# Patient Record
Sex: Female | Born: 1985 | Race: Asian | Hispanic: No | Marital: Married | Smoking: Never smoker
Health system: Southern US, Community
[De-identification: ages and names within clinical notes are randomized; demographics above are authoritative.]

## PROBLEM LIST (undated history)

## (undated) DIAGNOSIS — R51 Headache: Secondary | ICD-10-CM

## (undated) DIAGNOSIS — F329 Major depressive disorder, single episode, unspecified: Secondary | ICD-10-CM

## (undated) DIAGNOSIS — F32A Depression, unspecified: Secondary | ICD-10-CM

## (undated) DIAGNOSIS — R519 Headache, unspecified: Secondary | ICD-10-CM

## (undated) DIAGNOSIS — F419 Anxiety disorder, unspecified: Secondary | ICD-10-CM

## (undated) HISTORY — DX: Headache, unspecified: R51.9

## (undated) HISTORY — DX: Headache: R51

## (undated) HISTORY — PX: APPENDECTOMY: SHX54

## (undated) HISTORY — PX: LEEP: SHX91

## (undated) HISTORY — DX: Depression, unspecified: F32.A

## (undated) HISTORY — DX: Anxiety disorder, unspecified: F41.9

## (undated) HISTORY — DX: Major depressive disorder, single episode, unspecified: F32.9

---

## 2018-05-09 LAB — OB RESULTS CONSOLE RUBELLA ANTIBODY, IGM: Rubella: IMMUNE

## 2018-05-09 LAB — OB RESULTS CONSOLE GC/CHLAMYDIA
Chlamydia: NEGATIVE
Gonorrhea: NEGATIVE

## 2018-05-09 LAB — OB RESULTS CONSOLE RPR: RPR: NONREACTIVE

## 2018-05-09 LAB — OB RESULTS CONSOLE HIV ANTIBODY (ROUTINE TESTING): HIV: NONREACTIVE

## 2018-05-09 LAB — OB RESULTS CONSOLE HEPATITIS B SURFACE ANTIGEN: Hepatitis B Surface Ag: NEGATIVE

## 2018-10-30 NOTE — L&D Delivery Note (Signed)
Delivery Note At 6:16 AM a viable female was delivered via Vaginal, Spontaneous (Presentation: ROA).  APGAR: 9, 9; weight pending.   Placenta status: S, I. 3V Cord with the following complications: loose nuchal x 1; delivered through.  Cord pH: n/a  Anesthesia: CLEA  Episiotomy:  n/a Lacerations: 2nd degree Suture Repair: 3.0 vicryl rapide Est. Blood Loss (mL): 200  Mom to postpartum.  Baby to Couplet care / Skin to Skin.  Mitchel Honour 12/06/2018, 6:48 AM

## 2018-11-22 ENCOUNTER — Telehealth (HOSPITAL_COMMUNITY): Payer: Self-pay | Admitting: *Deleted

## 2018-12-02 ENCOUNTER — Encounter (HOSPITAL_COMMUNITY): Payer: Self-pay | Admitting: *Deleted

## 2018-12-02 LAB — OB RESULTS CONSOLE GBS: STREP GROUP B AG: NEGATIVE

## 2018-12-02 NOTE — Telephone Encounter (Signed)
Preadmission screen  

## 2018-12-05 ENCOUNTER — Encounter (HOSPITAL_COMMUNITY): Payer: Self-pay | Admitting: *Deleted

## 2018-12-05 ENCOUNTER — Telehealth (HOSPITAL_COMMUNITY): Payer: Self-pay | Admitting: *Deleted

## 2018-12-05 NOTE — Telephone Encounter (Signed)
Preadmission screen  

## 2018-12-06 ENCOUNTER — Inpatient Hospital Stay (HOSPITAL_COMMUNITY): Admission: RE | Admit: 2018-12-06 | Payer: BLUE CROSS/BLUE SHIELD | Source: Ambulatory Visit

## 2018-12-06 ENCOUNTER — Inpatient Hospital Stay (HOSPITAL_COMMUNITY)
Admission: AD | Admit: 2018-12-06 | Discharge: 2018-12-07 | DRG: 807 | Disposition: A | Discharge status: Home / Self Care | Payer: BLUE CROSS/BLUE SHIELD | Attending: Obstetrics & Gynecology | Admitting: Obstetrics & Gynecology

## 2018-12-06 ENCOUNTER — Encounter (HOSPITAL_COMMUNITY): Payer: Self-pay | Admitting: Emergency Medicine

## 2018-12-06 ENCOUNTER — Inpatient Hospital Stay (HOSPITAL_COMMUNITY): Payer: BLUE CROSS/BLUE SHIELD | Admitting: Anesthesiology

## 2018-12-06 DIAGNOSIS — Z3A39 39 weeks gestation of pregnancy: Secondary | ICD-10-CM

## 2018-12-06 DIAGNOSIS — Z3490 Encounter for supervision of normal pregnancy, unspecified, unspecified trimester: Secondary | ICD-10-CM

## 2018-12-06 DIAGNOSIS — Z3483 Encounter for supervision of other normal pregnancy, third trimester: Secondary | ICD-10-CM | POA: Diagnosis present

## 2018-12-06 LAB — CBC
HCT: 36.7 % (ref 36.0–46.0)
Hemoglobin: 11.7 g/dL — ABNORMAL LOW (ref 12.0–15.0)
MCH: 27 pg (ref 26.0–34.0)
MCHC: 31.9 g/dL (ref 30.0–36.0)
MCV: 84.8 fL (ref 80.0–100.0)
Platelets: 272 10*3/uL (ref 150–400)
RBC: 4.33 MIL/uL (ref 3.87–5.11)
RDW: 13.8 % (ref 11.5–15.5)
WBC: 11.6 10*3/uL — ABNORMAL HIGH (ref 4.0–10.5)
nRBC: 0 % (ref 0.0–0.2)

## 2018-12-06 LAB — ABO/RH: ABO/RH(D): A POS

## 2018-12-06 LAB — TYPE AND SCREEN
ABO/RH(D): A POS
Antibody Screen: NEGATIVE

## 2018-12-06 LAB — RPR: RPR Ser Ql: NONREACTIVE

## 2018-12-06 MED ORDER — OXYTOCIN BOLUS FROM INFUSION
500.0000 mL | Freq: Once | INTRAVENOUS | Status: AC
  Administered 2018-12-06: 500 mL via INTRAVENOUS

## 2018-12-06 MED ORDER — OXYCODONE-ACETAMINOPHEN 5-325 MG PO TABS: 2.0000 | ORAL_TABLET | ORAL | Status: DC | PRN

## 2018-12-06 MED ORDER — LACTATED RINGERS IV SOLN
INTRAVENOUS | Status: DC
  Administered 2018-12-06: 05:00:00 via INTRAVENOUS

## 2018-12-06 MED ORDER — LACTATED RINGERS IV SOLN: 500.0000 mL | INTRAVENOUS | Status: DC | PRN

## 2018-12-06 MED ORDER — ONDANSETRON HCL 4 MG PO TABS: 4.0000 mg | ORAL_TABLET | ORAL | Status: DC | PRN

## 2018-12-06 MED ORDER — ZOLPIDEM TARTRATE 5 MG PO TABS: 5.0000 mg | ORAL_TABLET | Freq: Every evening | ORAL | Status: DC | PRN

## 2018-12-06 MED ORDER — LIDOCAINE HCL (PF) 1 % IJ SOLN
INTRAMUSCULAR | Status: AC
  Filled 2018-12-06: qty 30

## 2018-12-06 MED ORDER — ACETAMINOPHEN 325 MG PO TABS: 650.0000 mg | ORAL_TABLET | ORAL | Status: DC | PRN

## 2018-12-06 MED ORDER — TETANUS-DIPHTH-ACELL PERTUSSIS 5-2.5-18.5 LF-MCG/0.5 IM SUSP: 0.5000 mL | Freq: Once | INTRAMUSCULAR | Status: DC

## 2018-12-06 MED ORDER — PRENATAL MULTIVITAMIN CH
1.0000 | ORAL_TABLET | Freq: Every day | ORAL | Status: DC
  Administered 2018-12-06: 1 via ORAL
  Filled 2018-12-06: qty 1

## 2018-12-06 MED ORDER — OXYTOCIN 40 UNITS IN NORMAL SALINE INFUSION - SIMPLE MED
2.5000 [IU]/h | INTRAVENOUS | Status: DC
  Administered 2018-12-06: 2.5 [IU]/h via INTRAVENOUS

## 2018-12-06 MED ORDER — SOD CITRATE-CITRIC ACID 500-334 MG/5ML PO SOLN: 30.0000 mL | ORAL | Status: DC | PRN

## 2018-12-06 MED ORDER — WITCH HAZEL-GLYCERIN EX PADS: 1.0000 "application " | MEDICATED_PAD | CUTANEOUS | Status: DC | PRN

## 2018-12-06 MED ORDER — OXYTOCIN 40 UNITS IN NORMAL SALINE INFUSION - SIMPLE MED
INTRAVENOUS | Status: AC
  Filled 2018-12-06: qty 1000

## 2018-12-06 MED ORDER — SENNOSIDES-DOCUSATE SODIUM 8.6-50 MG PO TABS
2.0000 | ORAL_TABLET | ORAL | Status: DC
  Administered 2018-12-07: 2 via ORAL
  Filled 2018-12-06: qty 2

## 2018-12-06 MED ORDER — BENZOCAINE-MENTHOL 20-0.5 % EX AERO
1.0000 "application " | INHALATION_SPRAY | CUTANEOUS | Status: DC | PRN
  Filled 2018-12-06: qty 56

## 2018-12-06 MED ORDER — OXYCODONE-ACETAMINOPHEN 5-325 MG PO TABS: 1.0000 | ORAL_TABLET | ORAL | Status: DC | PRN

## 2018-12-06 MED ORDER — FLEET ENEMA 7-19 GM/118ML RE ENEM: 1.0000 | ENEMA | RECTAL | Status: DC | PRN

## 2018-12-06 MED ORDER — LIDOCAINE HCL (PF) 1 % IJ SOLN
30.0000 mL | INTRAMUSCULAR | Status: DC | PRN
  Filled 2018-12-06: qty 30

## 2018-12-06 MED ORDER — LIDOCAINE HCL (PF) 1 % IJ SOLN
INTRAMUSCULAR | Status: DC | PRN
  Administered 2018-12-06 (×2): 4 mL via EPIDURAL

## 2018-12-06 MED ORDER — DIPHENHYDRAMINE HCL 25 MG PO CAPS: 25.0000 mg | ORAL_CAPSULE | Freq: Four times a day (QID) | ORAL | Status: DC | PRN

## 2018-12-06 MED ORDER — LACTATED RINGERS IV SOLN: 500.0000 mL | Freq: Once | INTRAVENOUS | Status: DC

## 2018-12-06 MED ORDER — DIBUCAINE 1 % RE OINT: 1.0000 "application " | TOPICAL_OINTMENT | RECTAL | Status: DC | PRN

## 2018-12-06 MED ORDER — SIMETHICONE 80 MG PO CHEW: 80.0000 mg | CHEWABLE_TABLET | ORAL | Status: DC | PRN

## 2018-12-06 MED ORDER — ONDANSETRON HCL 4 MG/2ML IJ SOLN: 4.0000 mg | Freq: Four times a day (QID) | INTRAMUSCULAR | Status: DC | PRN

## 2018-12-06 MED ORDER — IBUPROFEN 600 MG PO TABS
600.0000 mg | ORAL_TABLET | Freq: Four times a day (QID) | ORAL | Status: DC
  Administered 2018-12-06 – 2018-12-07 (×4): 600 mg via ORAL
  Filled 2018-12-06 (×4): qty 1

## 2018-12-06 MED ORDER — PHENYLEPHRINE 40 MCG/ML (10ML) SYRINGE FOR IV PUSH (FOR BLOOD PRESSURE SUPPORT)
PREFILLED_SYRINGE | INTRAVENOUS | Status: AC
  Filled 2018-12-06: qty 20

## 2018-12-06 MED ORDER — FENTANYL 2.5 MCG/ML BUPIVACAINE 1/10 % EPIDURAL INFUSION (WH - ANES)
INTRAMUSCULAR | Status: DC | PRN
  Administered 2018-12-06: 14 mL/h via EPIDURAL

## 2018-12-06 MED ORDER — ACETAMINOPHEN 325 MG PO TABS
650.0000 mg | ORAL_TABLET | ORAL | Status: DC | PRN
  Administered 2018-12-06: 650 mg via ORAL
  Filled 2018-12-06: qty 2

## 2018-12-06 MED ORDER — COCONUT OIL OIL: 1.0000 "application " | TOPICAL_OIL | Status: DC | PRN

## 2018-12-06 MED ORDER — ONDANSETRON HCL 4 MG/2ML IJ SOLN: 4.0000 mg | INTRAMUSCULAR | Status: DC | PRN

## 2018-12-06 MED ORDER — FENTANYL 2.5 MCG/ML BUPIVACAINE 1/10 % EPIDURAL INFUSION (WH - ANES)
INTRAMUSCULAR | Status: AC
  Filled 2018-12-06: qty 100

## 2018-12-06 NOTE — MAU Note (Signed)
CTX 5-6 minutes apart since 0030.  No LOF.  Some slight bleeding since having her membranes stripped in the office today.  + FM.  No complications.  GBS negative.

## 2018-12-06 NOTE — Anesthesia Preprocedure Evaluation (Signed)
Anesthesia Evaluation  Patient identified by MRN, date of birth, ID band Patient awake    Reviewed: Allergy & Precautions, Patient's Chart, lab work & pertinent test results  History of Anesthesia Complications Negative for: history of anesthetic complications  Airway Mallampati: I  TM Distance: >3 FB Neck ROM: Full    Dental  (+) Teeth Intact   Pulmonary neg pulmonary ROS,    Pulmonary exam normal breath sounds clear to auscultation       Cardiovascular negative cardio ROS Normal cardiovascular exam Rhythm:Regular Rate:Normal     Neuro/Psych negative neurological ROS     GI/Hepatic negative GI ROS, Neg liver ROS,   Endo/Other  negative endocrine ROS  Renal/GU negative Renal ROS     Musculoskeletal negative musculoskeletal ROS (+)   Abdominal   Peds  Hematology negative hematology ROS (+)   Anesthesia Other Findings Day of surgery medications reviewed with the patient.  Reproductive/Obstetrics (+) Pregnancy                             Anesthesia Physical Anesthesia Plan  ASA: II  Anesthesia Plan: Epidural   Post-op Pain Management:    Induction:   PONV Risk Score and Plan: Treatment may vary due to age or medical condition  Airway Management Planned: Natural Airway  Additional Equipment:   Intra-op Plan:   Post-operative Plan:   Informed Consent: I have reviewed the patients History and Physical, chart, labs and discussed the procedure including the risks, benefits and alternatives for the proposed anesthesia with the patient or authorized representative who has indicated his/her understanding and acceptance.       Plan Discussed with: CRNA  Anesthesia Plan Comments:         Anesthesia Quick Evaluation

## 2018-12-06 NOTE — Anesthesia Postprocedure Evaluation (Signed)
Anesthesia Post Note  Patient: Sheryl Byrd  Procedure(s) Performed: AN AD HOC LABOR EPIDURAL     Patient location during evaluation: Mother Baby Anesthesia Type: Epidural Level of consciousness: awake and alert and oriented Pain management: satisfactory to patient Vital Signs Assessment: post-procedure vital signs reviewed and stable Respiratory status: respiratory function stable Cardiovascular status: stable Postop Assessment: no headache, no backache, epidural receding, patient able to bend at knees, no signs of nausea or vomiting and adequate PO intake Anesthetic complications: no    Last Vitals:  Vitals:   12/06/18 0910 12/06/18 1245  BP: 109/72 102/62  Pulse: 77 85  Resp: 18 16  Temp: 36.7 C 36.9 C  SpO2:      Last Pain:  Vitals:   12/06/18 1245  TempSrc: Oral  PainSc: 1    Pain Goal: Patients Stated Pain Goal: 2 (12/06/18 1245)                 Destenee Guerry

## 2018-12-06 NOTE — H&P (Signed)
Sheryl Byrd is a 33 y.o. female presenting for labor.  CTX started around 1230 this am.  She denies VB and LOF.  Active FM.  Antepartum course complicated by anxiety/depression; stable on no medications.  GBS negative.  Comfortable with CLEA.  OB History    Gravida  2   Para  1   Term  1   Preterm      AB      Living  1     SAB      TAB      Ectopic      Multiple      Live Births  1          Past Medical History:  Diagnosis Date  . Anxiety    PP anxiety  . Depression   . Headache    Past Surgical History:  Procedure Laterality Date  . APPENDECTOMY    . LEEP     Family History: family history includes Diabetes in her maternal grandfather; Heart disease in her father and paternal grandfather; Hypertension in her father; Stroke in her maternal grandmother. Social History:  reports that she has never smoked. She has never used smokeless tobacco. She reports previous alcohol use. She reports that she does not use drugs.     Maternal Diabetes: No Genetic Screening: Normal Maternal Ultrasounds/Referrals: Normal Fetal Ultrasounds or other Referrals:  None Maternal Substance Abuse:  No Significant Maternal Medications:  None Significant Maternal Lab Results:  Lab values include: Group B Strep negative Other Comments:  None  ROS Maternal Medical History:  Reason for admission: Contractions.   Contractions: Onset was 6-12 hours ago.   Frequency: regular.   Perceived severity is moderate.    Fetal activity: Perceived fetal activity is normal.   Last perceived fetal movement was within the past hour.    Prenatal complications: no prenatal complications Prenatal Complications - Diabetes: none.    Dilation: 8.5 Effacement (%): 100 Station: -1 Exam by:: Southern CompanySavannah Brendle RN Blood pressure 103/65, pulse 85, temperature 98.1 F (36.7 C), temperature source Oral, resp. rate 20, height 5\' 4"  (1.626 m), weight 63.2 kg, last menstrual period 03/06/2018, SpO2  100 %. Maternal Exam:  Uterine Assessment: Contraction strength is moderate.  Contraction frequency is regular.   Abdomen: Patient reports no abdominal tenderness. Fundal height is c/w dates.   Estimated fetal weight is 7#.   Fetal presentation: vertex     Physical Exam  Constitutional: She is oriented to person, place, and time. She appears well-developed and well-nourished.  GI: Soft. There is no rebound and no guarding.  Neurological: She is alert and oriented to person, place, and time.  Skin: Skin is warm and dry.  Psychiatric: She has a normal mood and affect. Her behavior is normal.    Prenatal labs: ABO, Rh: --/--/A POS (02/07 0330) Antibody: NEG (02/07 0330) Rubella: Immune (07/11 0000) RPR: Nonreactive (07/11 0000)  HBsAg: Negative (07/11 0000)  HIV: Non-reactive (07/11 0000)  GBS: Negative (02/03 0000)   Assessment/Plan: 32yo G2P1001 at 39 weeks with labor -Patient offered AROM but would like to wait on her mother to arrive -Anticipate NSVD  Mitchel HonourMegan Juelz Whittenberg 12/06/2018, 5:48 AM

## 2018-12-06 NOTE — Lactation Note (Signed)
This note was copied from a baby's chart. Lactation Consultation Note  Patient Name: Sheryl Byrd Date: 12/06/2018 Reason for consult: Initial assessment;Term P2, 16 hour female infant. Per parents, infant has 3 voids and 3 stools since delivery. Per mom, she breastfeed her eldest daughter for 5 months but plan to breast her son longer. Mom demonstrated hand expression and easily expressed 3 ml of colostrum that was spoon feed to infant. Mom latched infant on right breast using the cross cradle hold, nose to breast, top lip was flanged out with bottom jaw lowered, swallows heard by LC. Infant breastfeed for 10 minutes and was still breastfeeding as LC left room. Mom knows to breastfeed according hunger cues and not exceed 3 hours without breastfeeding infant. LC discussed I & O. Reviewed Baby & Me book's Breastfeeding Basics.  Mom knows to call Nurse or LC if she has any questions, concerns or need assistance with latching infant to breast. Mom made aware of O/P services, breastfeeding support groups, community resources, and our phone # for post-discharge questions.   Maternal Data Formula Feeding for Exclusion: No Has patient been taught Hand Expression?: Yes(Mom easily hande express 59ml of colostrum that was spoon feed to infant.)  Feeding Feeding Type: Breast Fed  LATCH Score Latch: Grasps breast easily, tongue down, lips flanged, rhythmical sucking.  Audible Swallowing: Spontaneous and intermittent  Type of Nipple: Everted at rest and after stimulation  Comfort (Breast/Nipple): Soft / non-tender  Hold (Positioning): Assistance needed to correctly position infant at breast and maintain latch.  LATCH Score: 9  Interventions Interventions: Breast feeding basics reviewed;Assisted with latch;Skin to skin;Adjust position;Support pillows;Hand express  Lactation Tools Discussed/Used WIC Program: No   Consult Status Consult Status: Follow-up Date:  12/07/18 Follow-up type: In-patient    Danelle Earthly 12/06/2018, 11:01 PM

## 2018-12-06 NOTE — Anesthesia Procedure Notes (Signed)
Epidural Patient location during procedure: OB Start time: 12/06/2018 4:17 AM End time: 12/06/2018 4:19 AM  Staffing Anesthesiologist: Kaylyn Layer, MD Performed: anesthesiologist   Preanesthetic Checklist Completed: patient identified, pre-op evaluation, timeout performed, IV checked, risks and benefits discussed and monitors and equipment checked  Epidural Patient position: sitting Prep: site prepped and draped and DuraPrep Patient monitoring: continuous pulse ox, blood pressure, heart rate and cardiac monitor Approach: midline Location: L3-L4 Injection technique: LOR air  Needle:  Needle type: Tuohy  Needle gauge: 17 G Needle length: 9 cm Needle insertion depth: 3.5 cm Catheter type: closed end flexible Catheter size: 19 Gauge Catheter at skin depth: 8.5 cm Test dose: negative and Other (1% lidocaine)  Assessment Events: blood not aspirated, injection not painful, no injection resistance, negative IV test and no paresthesia  Additional Notes Patient identified. Risks, benefits, and alternatives discussed with patient including but not limited to bleeding, infection, nerve damage, paralysis, failed block, incomplete pain control, headache, blood pressure changes, nausea, vomiting, reactions to medication, itching, and postpartum back pain. Confirmed with bedside nurse the patient's most recent platelet count. Confirmed with patient that they are not currently taking any anticoagulation, have any bleeding history, or any family history of bleeding disorders. Patient expressed understanding and wished to proceed. All questions were answered. Sterile technique was used throughout the entire procedure. Crisp LOR on first pass. Please see nursing notes for vital signs. Test dose was given through epidural catheter and negative prior to continuing to dose epidural or start infusion. Warning signs of high block given to the patient including shortness of breath, tingling/numbness in  hands, complete motor block, or any concerning symptoms with instructions to call for help. Patient was given instructions on fall risk and not to get out of bed. All questions and concerns addressed with instructions to call with any issues or inadequate analgesia.  Reason for block:procedure for pain

## 2018-12-07 LAB — CBC
HCT: 33 % — ABNORMAL LOW (ref 36.0–46.0)
Hemoglobin: 10.5 g/dL — ABNORMAL LOW (ref 12.0–15.0)
MCH: 26.8 pg (ref 26.0–34.0)
MCHC: 31.8 g/dL (ref 30.0–36.0)
MCV: 84.2 fL (ref 80.0–100.0)
Platelets: 258 10*3/uL (ref 150–400)
RBC: 3.92 MIL/uL (ref 3.87–5.11)
RDW: 13.9 % (ref 11.5–15.5)
WBC: 10.9 10*3/uL — ABNORMAL HIGH (ref 4.0–10.5)
nRBC: 0 % (ref 0.0–0.2)

## 2018-12-07 NOTE — Progress Notes (Signed)
CSW received consult for hx of Anxiety and Depression.  CSW met with Sheryl Byrd to offer support and complete assessment.    CSW met with Sheryl Byrd at bedside to discuss consult for history of anxiety/depression, Sheryl Byrd's mother present. CSW asked Sheryl Byrd's mother to leave during assessment with Sheryl Byrd's permission, Sheryl Byrd's mother left voluntarily. CSW introduced self and explained reason for consult. Sheryl Byrd was welcoming and pleasant during assessment. CSW and Sheryl Byrd discussed Sheryl Byrd's mental health history, Sheryl Byrd reported that she was diagnosed with depression and anxiety about 2 years ago. Sheryl Byrd reported that during that time she had multiple life stressors and was working on adjusting to everything. CSW acknowledged and validated Sheryl Byrd's experience. Sheryl Byrd denied any current depressive symptoms and reported that she feels a little anxious about having 2 young children at home now. CSW normalized Sheryl Byrd's feelings. CSW asked Sheryl Byrd about medication/therapy for depression/anxiety. Sheryl Byrd reported that she was on medication about 2 years ago and was unable to recall the name. Sheryl Byrd reported that she is no longer on any medication nor receiving therapy. CSW inquired about Sheryl Byrd's coping skills. Sheryl Byrd reported tat she snuggles with her daughter, takes a breather, reads a book, watches a show or takes a bath. CSW praised Sheryl Byrd for having healthy coping skills. Sheryl Byrd reported that her coping skills work to treat her anxiety. Sheryl Byrd presented calm and remained engaged during assessment. Sheryl Byrd possessed some insight about her mental health history and did not demonstrate any acute mental health signs/symptoms. CSW assessed for safety, Sheryl Byrd denied SI, HI and domestic violence. CSW informed Sheryl Byrd that due to her mental health history she may be more susceptible to PPD. 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   CSW provided education regarding the baby blues period vs. perinatal mood disorders,  discussed treatment and gave resources for mental health follow up if concerns arise.  CSW recommends self-evaluation during the postpartum time period using the New Mom Checklist from Postpartum Progress and encouraged Sheryl Byrd to contact a medical professional if symptoms are noted at any time.    CSW identifies no further need for intervention and no barriers to discharge at this time.  Yumalay Circle, LCSWA Clinical Social Worker [facility] Cell#: [phone]  

## 2018-12-07 NOTE — Discharge Summary (Signed)
Obstetric Discharge Summary Reason for Admission: onset of labor Prenatal Procedures: none Intrapartum Procedures: spontaneous vaginal delivery Postpartum Procedures: none Complications-Operative and Postpartum: 2 degree perineal laceration Hemoglobin  Date Value Ref Range Status  12/07/2018 10.5 (L) 12.0 - 15.0 g/dL Final   HCT  Date Value Ref Range Status  12/07/2018 33.0 (L) 36.0 - 46.0 % Final    Physical Exam:  General: alert, cooperative, appears stated age and no distress Lochia: appropriate Uterine Fundus: firm Incision: healing well DVT Evaluation: No evidence of DVT seen on physical exam.  Discharge Diagnoses: Term Pregnancy-delivered  Discharge Information: Date: 12/07/2018 Activity: pelvic rest Diet: routine Medications: None Condition: stable Instructions: refer to practice specific booklet Discharge to: home   Newborn Data: Live born female  Birth Weight: 6 lb 9.1 oz (2980 g) APGAR: 9, 9  Newborn Delivery   Birth date/time:  12/06/2018 06:16:00 Delivery type:  Vaginal, Spontaneous     Home with mother.  Sheryl Byrd 12/07/2018, 9:47 AM

## 2018-12-07 NOTE — Lactation Note (Signed)
This note was copied from a baby's chart. Lactation Consultation Note  Patient Name: Sheryl Byrd [id number] Date: 12/07/2018 Reason for consult: Follow-up assessment;Term  P2 mother whose infant is now 15 hours old.  Mother breast fed her first child for 5 months.  Mother had no questions/concerns related to breast feeding.  She will continue feeding 8-12 times/24 hours or sooner if baby shows feeding cues.  She feels like breast feeding is going well and denies pain with latching.  Engorgement prevention/treatment discussed.  Mother has a manual pump and a DEBP for home use.  She has our OP phone number for questions/concerns that may arise after discharge.  Family present.   Maternal Data Formula Feeding for Exclusion: No Has patient been taught Hand Expression?: Yes Does the patient have breastfeeding experience prior to this delivery?: Yes  Feeding Feeding Type: Breast Fed  LATCH Score                   Interventions    Lactation Tools Discussed/Used WIC Program: No   Consult Status Consult Status: Complete Date: 12/07/18 Follow-up type: Call as needed    Jermal Dismuke R Jacarius Handel 12/07/2018, 8:15 AM

## 2021-03-15 LAB — OB RESULTS CONSOLE RPR: RPR: NONREACTIVE

## 2021-03-15 LAB — OB RESULTS CONSOLE RUBELLA ANTIBODY, IGM: Rubella: IMMUNE

## 2021-03-15 LAB — OB RESULTS CONSOLE ABO/RH: RH Type: POSITIVE

## 2021-03-15 LAB — OB RESULTS CONSOLE HEPATITIS B SURFACE ANTIGEN: Hepatitis B Surface Ag: NEGATIVE

## 2021-03-18 LAB — OB RESULTS CONSOLE GC/CHLAMYDIA: Gonorrhea: NEGATIVE

## 2021-07-07 ENCOUNTER — Emergency Department (HOSPITAL_BASED_OUTPATIENT_CLINIC_OR_DEPARTMENT_OTHER)
Admission: EM | Admit: 2021-07-07 | Discharge: 2021-07-07 | Disposition: A | Discharge status: Home / Self Care | Payer: 59 | Attending: Emergency Medicine | Admitting: Emergency Medicine

## 2021-07-07 ENCOUNTER — Encounter (HOSPITAL_BASED_OUTPATIENT_CLINIC_OR_DEPARTMENT_OTHER): Payer: Self-pay | Admitting: *Deleted

## 2021-07-07 ENCOUNTER — Other Ambulatory Visit: Payer: Self-pay

## 2021-07-07 ENCOUNTER — Emergency Department (HOSPITAL_BASED_OUTPATIENT_CLINIC_OR_DEPARTMENT_OTHER): Payer: 59

## 2021-07-07 DIAGNOSIS — O26892 Other specified pregnancy related conditions, second trimester: Secondary | ICD-10-CM | POA: Diagnosis not present

## 2021-07-07 DIAGNOSIS — R0602 Shortness of breath: Secondary | ICD-10-CM | POA: Insufficient documentation

## 2021-07-07 DIAGNOSIS — Z3A26 26 weeks gestation of pregnancy: Secondary | ICD-10-CM | POA: Insufficient documentation

## 2021-07-07 LAB — CBC WITH DIFFERENTIAL/PLATELET
Abs Immature Granulocytes: 0.05 10*3/uL (ref 0.00–0.07)
Basophils Absolute: 0.1 10*3/uL (ref 0.0–0.1)
Basophils Relative: 1 %
Eosinophils Absolute: 0.1 10*3/uL (ref 0.0–0.5)
Eosinophils Relative: 1 %
HCT: 34.7 % — ABNORMAL LOW (ref 36.0–46.0)
Hemoglobin: 11.6 g/dL — ABNORMAL LOW (ref 12.0–15.0)
Immature Granulocytes: 1 %
Lymphocytes Relative: 19 %
Lymphs Abs: 2 10*3/uL (ref 0.7–4.0)
MCH: 30 pg (ref 26.0–34.0)
MCHC: 33.4 g/dL (ref 30.0–36.0)
MCV: 89.7 fL (ref 80.0–100.0)
Monocytes Absolute: 1 10*3/uL (ref 0.1–1.0)
Monocytes Relative: 10 %
Neutro Abs: 7.2 10*3/uL (ref 1.7–7.7)
Neutrophils Relative %: 68 %
Platelets: 287 10*3/uL (ref 150–400)
RBC: 3.87 MIL/uL (ref 3.87–5.11)
RDW: 12.8 % (ref 11.5–15.5)
WBC: 10.4 10*3/uL (ref 4.0–10.5)
nRBC: 0 % (ref 0.0–0.2)

## 2021-07-07 LAB — BRAIN NATRIURETIC PEPTIDE: B Natriuretic Peptide: 35.2 pg/mL (ref 0.0–100.0)

## 2021-07-07 LAB — TROPONIN I (HIGH SENSITIVITY): Troponin I (High Sensitivity): <2 ng/L (ref <18)

## 2021-07-07 LAB — BASIC METABOLIC PANEL
Anion gap: 6 (ref 5–15)
BUN: 10 mg/dL (ref 6–20)
CO2: 24 mmol/L (ref 22–32)
Calcium: 8.7 mg/dL — ABNORMAL LOW (ref 8.9–10.3)
Chloride: 106 mmol/L (ref 98–111)
Creatinine, Ser: 0.61 mg/dL (ref 0.44–1.00)
GFR, Estimated: >60 mL/min (ref >60)
Glucose, Bld: 79 mg/dL (ref 70–99)
Potassium: 3.9 mmol/L (ref 3.5–5.1)
Sodium: 136 mmol/L (ref 135–145)

## 2021-07-07 MED ORDER — IOHEXOL 350 MG/ML SOLN
75.0000 mL | Freq: Once | INTRAVENOUS | Status: AC | PRN
  Administered 2021-07-07: 75 mL via INTRAVENOUS

## 2021-07-07 NOTE — Progress Notes (Signed)
G3P2 at 26 6/7 weeks reports to [facility] ED with c/o chest pain and shortness of breath.  Monitors applied per ED RN.  Receives [facility] with Dr Rosie Fate.

## 2021-07-07 NOTE — ED Provider Notes (Signed)
[facility] EMERGENCY DEPARTMENT Provider Note   CSN: [id number] Arrival date & time: 07/07/21  1646     History Chief Complaint  Patient presents with   Shortness of Breath    Sheryl Byrd is a 35 y.o. female.  35 year old female presents at 26-week gestation with normal/routine prenatal care with complaint of shortness of breath.  Patient states that her symptoms started last night, have become progressively worse throughout the day.  Shortness of breath is worse with lying flat and sitting up, not necessarily exertional.  States that she is having pain in her back between her shoulder blades, had some improvement with Tylenol.  Patient called her OB office and was advised to the emergency room.  Patient is G3, P2, denies similar symptoms with prior pregnancies.  Denies fevers, chills, cough, congestion, lower extremity swelling vaginal bleeding, leaking fluid.  Reports normal fetal activity.      Past Medical History:  Diagnosis Date   Anxiety    PP anxiety   Depression    Headache     Patient Active Problem List   Diagnosis Date Noted   Indication for care in labor or delivery 12/06/2018   Pregnancy 12/06/2018    Past Surgical History:  Procedure Laterality Date   APPENDECTOMY     LEEP       OB History     Gravida  3   Para  2   Term  2   Preterm      AB      Living  2      SAB      IAB      Ectopic      Multiple  0   Live Births  2           Family History  Problem Relation Age of Onset   Heart disease Father    Hypertension Father    Stroke Maternal Grandmother    Diabetes Maternal Grandfather    Heart disease Paternal Grandfather     Social History   Tobacco Use   Smoking status: Never   Smokeless tobacco: Never  Vaping Use   Vaping Use: Never used  Substance Use Topics   Alcohol use: Not Currently   Drug use: Never    Home Medications Prior to Admission medications   Medication Sig Start Date End Date  Taking? Authorizing Provider  Prenatal Vit-Fe Fumarate-FA (PRENATAL MULTIVITAMIN) TABS tablet Take 1 tablet by mouth daily at 12 noon.    [provider]    Allergies    Patient has no known allergies.  Review of Systems   Review of Systems  Constitutional:  Negative for chills and fever.  Respiratory:  Positive for shortness of breath.   Cardiovascular:  Negative for chest pain.  Gastrointestinal:  Negative for abdominal pain, constipation, diarrhea, nausea and vomiting.  Genitourinary:  Negative for vaginal bleeding and vaginal discharge.  Musculoskeletal:  Positive for back pain. Negative for myalgias.  Skin:  Negative for rash and wound.  Allergic/Immunologic: Positive for immunocompromised state.  Neurological:  Negative for weakness and numbness.  All other systems reviewed and are negative.  Physical Exam Updated Vital Signs BP 101/70   Pulse 86   Temp 99 F (37.2 C)   Resp 19   Ht 5\' 4"  (1.626 m)   Wt 56.2 kg   LMP 01/04/2021   SpO2 99%   BMI 21.28 kg/m   Physical Exam Vitals and nursing note reviewed.  Constitutional:  General: She is not in acute distress.    Appearance: She is well-developed. She is not diaphoretic.  HENT:     Head: Normocephalic and atraumatic.  Cardiovascular:     Rate and Rhythm: Normal rate and regular rhythm.     Pulses: Normal pulses.  Pulmonary:     Effort: Tachypnea present.     Breath sounds: Normal breath sounds. No decreased breath sounds or wheezing.     Comments: Mildly tachypneic, able to speak in complete sentences. Chest:     Chest wall: No tenderness.  Abdominal:     Palpations: Abdomen is soft.     Comments: Gravid  Musculoskeletal:     Cervical back: Neck supple.     Thoracic back: No tenderness or bony tenderness.     Right lower leg: No tenderness. No edema.     Left lower leg: No tenderness. No edema.  Skin:    General: Skin is warm and dry.     Findings: No erythema or rash.  Neurological:      Mental Status: She is alert and oriented to person, place, and time.  Psychiatric:        Behavior: Behavior normal.    ED Results / Procedures / Treatments   Labs (all labs ordered are listed, but only abnormal results are displayed) Labs Reviewed  BASIC METABOLIC PANEL - Abnormal; Notable for the following components:      Result Value   Calcium 8.7 (*)    All other components within normal limits  CBC WITH DIFFERENTIAL/PLATELET - Abnormal; Notable for the following components:   Hemoglobin 11.6 (*)    HCT 34.7 (*)    All other components within normal limits  BRAIN NATRIURETIC PEPTIDE  TROPONIN I (HIGH SENSITIVITY)    EKG None  Radiology CT Angio Chest PE W/Cm &/Or Wo Cm  Result Date: 07/07/2021 CLINICAL DATA:  PE suspected, high prob Shortness of breath and chest pain. Pregnant patient at [redacted] weeks gestation. EXAM: CT ANGIOGRAPHY CHEST WITH CONTRAST TECHNIQUE: Multidetector CT imaging of the chest was performed using the standard protocol during bolus administration of intravenous contrast. Multiplanar CT image reconstructions and MIPs were obtained to evaluate the vascular anatomy. CONTRAST:  5mL OMNIPAQUE IOHEXOL 350 MG/ML SOLN COMPARISON:  None. FINDINGS: Cardiovascular: There are no filling defects within the pulmonary arteries to suggest pulmonary embolus. Thoracic aorta is normal in caliber. No aortic dissection or acute aortic findings. The heart is normal in size. Trace pericardial fluid anteriorly may be trace effusion or physiologic. Mediastinum/Nodes: No mediastinal or hilar adenopathy. Decompressed esophagus. No suspicious thyroid nodule. Lungs/Pleura: Mild hypoventilatory changes in the dependent lungs. No pneumonia or focal airspace disease. No pleural effusion. No findings of pulmonary edema. No pneumothorax. Trachea and central bronchi are patent. Upper Abdomen: No acute or unexpected findings. Musculoskeletal: There are no acute or suspicious osseous abnormalities. No  chest wall soft tissue abnormality. Review of the MIP images confirms the above findings. IMPRESSION: No pulmonary embolus or acute intrathoracic abnormality. Electronically Signed   By: Narda Rutherford M.D.   On: 07/07/2021 18:43    Procedures Procedures   Medications Ordered in ED Medications  iohexol (OMNIPAQUE) 350 MG/ML injection 75 mL (75 mLs Intravenous Contrast Given 07/07/21 1757)    ED Course  I have reviewed the triage vital signs and the nursing notes.  Pertinent labs & imaging results that were available during my care of the patient were reviewed by me and considered in my medical decision making (see  chart for details).  Clinical Course as of 07/07/21 Tennis Must Jul 07, 2021  8649 35 year old female presents at [redacted] weeks gestation with complaint of shortness of breath and a discomfort in her chest into her shoulder blades.  Concern for PE in pregnancy, discussed risks of radiation, feel risks of missed PE outweigh risks to the fetus and proceed with CTA of the chest which is negative for PE.  Remaining work-up is reassuring including normal CBC, BMP, BNP and negative troponin. [LM]  1854 EKG shows normal sinus rhythm with rate of 87, no ischemic changes.  Plan is for patient to follow-up with her OB, return to ED for worsening or concerning symptoms. [LM]  1854 Vitals reviewed and reassuring, O2 sat 99% on room air. [LM]    Clinical Course User Index [LM] Alden Hipp   MDM Rules/Calculators/A&P                           Final Clinical Impression(s) / ED Diagnoses Final diagnoses:  SOB (shortness of breath)    Rx / DC Orders ED Discharge Orders     None        Alden Hipp 07/07/21 Herbie Baltimore    Ernie Avena, MD 07/08/21 1209

## 2021-07-07 NOTE — Discharge Instructions (Addendum)
Follow-up with your OB office.  Your work-up today is reassuring.

## 2021-07-07 NOTE — ED Notes (Signed)
Holding on CT scan until receiving a call back from [facility] nurse

## 2021-07-07 NOTE — Progress Notes (Signed)
NST reactive for 26 6/[redacted] weeks gestation.  Cleared by OB Service.  Pt to CT for further testing at this time.   Pt has appt with Dr Elon Spanner on 9/21.  Keep appt.  Call office with any concerns or questions.

## 2021-07-07 NOTE — ED Triage Notes (Addendum)
C/o SOB started last pm increased through the day , chest pain last night which radiates to back b/w shoulder blades , relieved with tylenol, same pain today " off an on " ,  called Lorelee Cover MD , instructed to ED for eval. Pt is 26 weeks preg

## 2021-07-07 NOTE — ED Notes (Signed)
Pt taken of toco monitor for transport to CT. Carollee Herter, RN with OB states baby looks great. Provider notified.

## 2021-07-07 NOTE — ED Notes (Signed)
OB rapid response notified , Doctor, hospital

## 2021-07-07 NOTE — ED Notes (Signed)
ED Provider at bedside. 

## 2021-07-20 LAB — OB RESULTS CONSOLE HIV ANTIBODY (ROUTINE TESTING): HIV: NONREACTIVE

## 2021-09-12 ENCOUNTER — Encounter (HOSPITAL_COMMUNITY): Payer: Self-pay | Admitting: Obstetrics and Gynecology

## 2021-09-12 ENCOUNTER — Inpatient Hospital Stay (EMERGENCY_DEPARTMENT_HOSPITAL)
Admission: AD | Admit: 2021-09-12 | Discharge: 2021-09-13 | Disposition: A | Discharge status: Home / Self Care | Payer: 59 | Source: Home / Self Care | Attending: Obstetrics and Gynecology | Admitting: Obstetrics and Gynecology

## 2021-09-12 ENCOUNTER — Other Ambulatory Visit: Payer: Self-pay

## 2021-09-12 DIAGNOSIS — Z3689 Encounter for other specified antenatal screening: Secondary | ICD-10-CM

## 2021-09-12 DIAGNOSIS — O26893 Other specified pregnancy related conditions, third trimester: Secondary | ICD-10-CM | POA: Diagnosis not present

## 2021-09-12 DIAGNOSIS — O4693 Antepartum hemorrhage, unspecified, third trimester: Secondary | ICD-10-CM | POA: Diagnosis not present

## 2021-09-12 DIAGNOSIS — M549 Dorsalgia, unspecified: Secondary | ICD-10-CM | POA: Insufficient documentation

## 2021-09-12 DIAGNOSIS — O479 False labor, unspecified: Secondary | ICD-10-CM

## 2021-09-12 DIAGNOSIS — J101 Influenza due to other identified influenza virus with other respiratory manifestations: Secondary | ICD-10-CM

## 2021-09-12 DIAGNOSIS — O471 False labor at or after 37 completed weeks of gestation: Secondary | ICD-10-CM | POA: Insufficient documentation

## 2021-09-12 DIAGNOSIS — O99513 Diseases of the respiratory system complicating pregnancy, third trimester: Secondary | ICD-10-CM | POA: Insufficient documentation

## 2021-09-12 DIAGNOSIS — Z3A36 36 weeks gestation of pregnancy: Secondary | ICD-10-CM | POA: Insufficient documentation

## 2021-09-12 DIAGNOSIS — Z20822 Contact with and (suspected) exposure to covid-19: Secondary | ICD-10-CM | POA: Insufficient documentation

## 2021-09-12 DIAGNOSIS — Z369 Encounter for antenatal screening, unspecified: Secondary | ICD-10-CM | POA: Insufficient documentation

## 2021-09-12 DIAGNOSIS — O212 Late vomiting of pregnancy: Secondary | ICD-10-CM | POA: Insufficient documentation

## 2021-09-12 LAB — URINALYSIS, ROUTINE W REFLEX MICROSCOPIC
Bilirubin Urine: NEGATIVE
Glucose, UA: NEGATIVE mg/dL
Ketones, ur: 80 mg/dL — AB
Nitrite: NEGATIVE
Protein, ur: 100 mg/dL — AB
Specific Gravity, Urine: 1.02 (ref 1.005–1.030)
pH: 5 (ref 5.0–8.0)

## 2021-09-12 MED ORDER — ONDANSETRON HCL 4 MG/2ML IJ SOLN
4.0000 mg | Freq: Once | INTRAMUSCULAR | Status: AC
  Administered 2021-09-12: 4 mg via INTRAVENOUS
  Filled 2021-09-12: qty 2

## 2021-09-12 MED ORDER — ACETAMINOPHEN 10 MG/ML IV SOLN
1000.0000 mg | Freq: Four times a day (QID) | INTRAVENOUS | Status: DC
  Administered 2021-09-12: 1000 mg via INTRAVENOUS
  Filled 2021-09-12 (×3): qty 100

## 2021-09-12 MED ORDER — LACTATED RINGERS IV BOLUS
1000.0000 mL | Freq: Once | INTRAVENOUS | Status: AC
  Administered 2021-09-12: 1000 mL via INTRAVENOUS

## 2021-09-12 NOTE — MAU Note (Signed)
Having round ligament pain, lower back pain, lightening in pelvis, sore throat, severe h/a, cough, vomited x 3-4 since 1700. Denies VB or LOF. Child at home positive for flu

## 2021-09-13 ENCOUNTER — Inpatient Hospital Stay (HOSPITAL_COMMUNITY)
Admission: AD | Admit: 2021-09-13 | Discharge: 2021-09-15 | DRG: 805 | Disposition: A | Discharge status: Home / Self Care | Payer: 59 | Attending: Obstetrics and Gynecology | Admitting: Obstetrics and Gynecology

## 2021-09-13 ENCOUNTER — Other Ambulatory Visit: Payer: Self-pay

## 2021-09-13 ENCOUNTER — Inpatient Hospital Stay (HOSPITAL_COMMUNITY): Payer: 59 | Admitting: Anesthesiology

## 2021-09-13 ENCOUNTER — Encounter (HOSPITAL_COMMUNITY): Payer: Self-pay | Admitting: Obstetrics and Gynecology

## 2021-09-13 DIAGNOSIS — O26893 Other specified pregnancy related conditions, third trimester: Secondary | ICD-10-CM | POA: Diagnosis present

## 2021-09-13 DIAGNOSIS — O4693 Antepartum hemorrhage, unspecified, third trimester: Secondary | ICD-10-CM | POA: Diagnosis not present

## 2021-09-13 DIAGNOSIS — J101 Influenza due to other identified influenza virus with other respiratory manifestations: Secondary | ICD-10-CM

## 2021-09-13 DIAGNOSIS — O479 False labor, unspecified: Secondary | ICD-10-CM

## 2021-09-13 DIAGNOSIS — Z3A36 36 weeks gestation of pregnancy: Secondary | ICD-10-CM

## 2021-09-13 DIAGNOSIS — U071 COVID-19: Secondary | ICD-10-CM | POA: Diagnosis present

## 2021-09-13 DIAGNOSIS — O9852 Other viral diseases complicating childbirth: Secondary | ICD-10-CM | POA: Diagnosis present

## 2021-09-13 DIAGNOSIS — Z3689 Encounter for other specified antenatal screening: Secondary | ICD-10-CM

## 2021-09-13 LAB — URINALYSIS, ROUTINE W REFLEX MICROSCOPIC
Bilirubin Urine: NEGATIVE
Glucose, UA: NEGATIVE mg/dL
Ketones, ur: 80 mg/dL — AB
Leukocytes,Ua: NEGATIVE
Nitrite: NEGATIVE
Protein, ur: 30 mg/dL — AB
Specific Gravity, Urine: 1.012 (ref 1.005–1.030)
pH: 5 (ref 5.0–8.0)

## 2021-09-13 LAB — RESP PANEL BY RT-PCR (FLU A&B, COVID) ARPGX2
Influenza A by PCR: POSITIVE — AB
Influenza A by PCR: POSITIVE — AB
Influenza B by PCR: NEGATIVE
Influenza B by PCR: NEGATIVE
SARS Coronavirus 2 by RT PCR: NEGATIVE
SARS Coronavirus 2 by RT PCR: POSITIVE — AB

## 2021-09-13 LAB — CBC
HCT: 36.3 % (ref 36.0–46.0)
Hemoglobin: 11.6 g/dL — ABNORMAL LOW (ref 12.0–15.0)
MCH: 27.6 pg (ref 26.0–34.0)
MCHC: 32 g/dL (ref 30.0–36.0)
MCV: 86.4 fL (ref 80.0–100.0)
Platelets: 259 10*3/uL (ref 150–400)
RBC: 4.2 MIL/uL (ref 3.87–5.11)
RDW: 13.3 % (ref 11.5–15.5)
WBC: 15.2 10*3/uL — ABNORMAL HIGH (ref 4.0–10.5)
nRBC: 0 % (ref 0.0–0.2)

## 2021-09-13 LAB — TYPE AND SCREEN
ABO/RH(D): A POS
Antibody Screen: NEGATIVE

## 2021-09-13 MED ORDER — OXYTOCIN-SODIUM CHLORIDE 30-0.9 UT/500ML-% IV SOLN
INTRAVENOUS | Status: AC
  Filled 2021-09-13: qty 500

## 2021-09-13 MED ORDER — LACTATED RINGERS IV SOLN: 500.0000 mL | Freq: Once | INTRAVENOUS | Status: DC

## 2021-09-13 MED ORDER — DIPHENHYDRAMINE HCL 50 MG/ML IJ SOLN: 12.5000 mg | INTRAMUSCULAR | Status: DC | PRN

## 2021-09-13 MED ORDER — SENNOSIDES-DOCUSATE SODIUM 8.6-50 MG PO TABS
2.0000 | ORAL_TABLET | ORAL | Status: DC
  Administered 2021-09-14 – 2021-09-15 (×2): 2 via ORAL
  Filled 2021-09-13 (×2): qty 2

## 2021-09-13 MED ORDER — LACTATED RINGERS IV BOLUS
1000.0000 mL | Freq: Once | INTRAVENOUS | Status: AC
  Administered 2021-09-13: 1000 mL via INTRAVENOUS

## 2021-09-13 MED ORDER — MORPHINE SULFATE (PF) 4 MG/ML IV SOLN
4.0000 mg | Freq: Once | INTRAVENOUS | Status: AC
  Administered 2021-09-13: 4 mg via INTRAMUSCULAR
  Filled 2021-09-13: qty 1

## 2021-09-13 MED ORDER — DIBUCAINE (PERIANAL) 1 % EX OINT: 1.0000 "application " | TOPICAL_OINTMENT | CUTANEOUS | Status: DC | PRN

## 2021-09-13 MED ORDER — OXYTOCIN-SODIUM CHLORIDE 30-0.9 UT/500ML-% IV SOLN
2.5000 [IU]/h | INTRAVENOUS | Status: DC
  Administered 2021-09-13: 2.5 [IU]/h via INTRAVENOUS

## 2021-09-13 MED ORDER — LACTATED RINGERS IV SOLN: INTRAVENOUS | Status: DC

## 2021-09-13 MED ORDER — LACTATED RINGERS IV SOLN: 500.0000 mL | Freq: Once | INTRAVENOUS | Status: AC

## 2021-09-13 MED ORDER — ACETAMINOPHEN 325 MG PO TABS: 650.0000 mg | ORAL_TABLET | ORAL | Status: DC | PRN

## 2021-09-13 MED ORDER — OXYCODONE HCL 5 MG PO TABS: 5.0000 mg | ORAL_TABLET | ORAL | Status: DC | PRN

## 2021-09-13 MED ORDER — PHENYLEPHRINE 40 MCG/ML (10ML) SYRINGE FOR IV PUSH (FOR BLOOD PRESSURE SUPPORT): 80.0000 ug | PREFILLED_SYRINGE | INTRAVENOUS | Status: DC | PRN

## 2021-09-13 MED ORDER — PHENYLEPHRINE 40 MCG/ML (10ML) SYRINGE FOR IV PUSH (FOR BLOOD PRESSURE SUPPORT)
80.0000 ug | PREFILLED_SYRINGE | INTRAVENOUS | Status: DC | PRN
  Filled 2021-09-13: qty 10

## 2021-09-13 MED ORDER — FLEET ENEMA 7-19 GM/118ML RE ENEM: 1.0000 | ENEMA | RECTAL | Status: DC | PRN

## 2021-09-13 MED ORDER — OXYCODONE HCL 5 MG PO TABS: 10.0000 mg | ORAL_TABLET | ORAL | Status: DC | PRN

## 2021-09-13 MED ORDER — LACTATED RINGERS IV SOLN
500.0000 mL | INTRAVENOUS | Status: DC | PRN
  Administered 2021-09-13: 500 mL via INTRAVENOUS

## 2021-09-13 MED ORDER — ONDANSETRON HCL 4 MG/2ML IJ SOLN: 4.0000 mg | INTRAMUSCULAR | Status: DC | PRN

## 2021-09-13 MED ORDER — ONDANSETRON HCL 4 MG/2ML IJ SOLN: 4.0000 mg | Freq: Four times a day (QID) | INTRAMUSCULAR | Status: DC | PRN

## 2021-09-13 MED ORDER — LIDOCAINE HCL (PF) 1 % IJ SOLN
INTRAMUSCULAR | Status: DC | PRN
  Administered 2021-09-13: 10 mL via EPIDURAL

## 2021-09-13 MED ORDER — OXYCODONE-ACETAMINOPHEN 5-325 MG PO TABS: 1.0000 | ORAL_TABLET | ORAL | Status: DC | PRN

## 2021-09-13 MED ORDER — ONDANSETRON 4 MG PO TBDP: 4.0000 mg | ORAL_TABLET | Freq: Three times a day (TID) | ORAL | 0 refills | Status: AC | PRN

## 2021-09-13 MED ORDER — FENTANYL-BUPIVACAINE-NACL 0.5-0.125-0.9 MG/250ML-% EP SOLN: 12.0000 mL/h | EPIDURAL | Status: DC | PRN

## 2021-09-13 MED ORDER — EPHEDRINE 5 MG/ML INJ: 10.0000 mg | INTRAVENOUS | Status: DC | PRN

## 2021-09-13 MED ORDER — OXYTOCIN BOLUS FROM INFUSION
333.0000 mL | Freq: Once | INTRAVENOUS | Status: AC
  Administered 2021-09-13: 333 mL via INTRAVENOUS

## 2021-09-13 MED ORDER — ZOLPIDEM TARTRATE 5 MG PO TABS: 5.0000 mg | ORAL_TABLET | Freq: Every evening | ORAL | Status: DC | PRN

## 2021-09-13 MED ORDER — SIMETHICONE 80 MG PO CHEW: 80.0000 mg | CHEWABLE_TABLET | ORAL | Status: DC | PRN

## 2021-09-13 MED ORDER — BENZOCAINE-MENTHOL 20-0.5 % EX AERO: 1.0000 "application " | INHALATION_SPRAY | CUTANEOUS | Status: DC | PRN

## 2021-09-13 MED ORDER — WITCH HAZEL-GLYCERIN EX PADS: 1.0000 "application " | MEDICATED_PAD | CUTANEOUS | Status: DC | PRN

## 2021-09-13 MED ORDER — DIPHENHYDRAMINE HCL 25 MG PO CAPS: 25.0000 mg | ORAL_CAPSULE | Freq: Four times a day (QID) | ORAL | Status: DC | PRN

## 2021-09-13 MED ORDER — LIDOCAINE HCL (PF) 1 % IJ SOLN: 30.0000 mL | INTRAMUSCULAR | Status: DC | PRN

## 2021-09-13 MED ORDER — SOD CITRATE-CITRIC ACID 500-334 MG/5ML PO SOLN: 30.0000 mL | ORAL | Status: DC | PRN

## 2021-09-13 MED ORDER — PRENATAL MULTIVITAMIN CH
1.0000 | ORAL_TABLET | Freq: Every day | ORAL | Status: DC
  Administered 2021-09-14 – 2021-09-15 (×2): 1 via ORAL
  Filled 2021-09-13 (×2): qty 1

## 2021-09-13 MED ORDER — ONDANSETRON HCL 4 MG PO TABS: 4.0000 mg | ORAL_TABLET | ORAL | Status: DC | PRN

## 2021-09-13 MED ORDER — OXYCODONE-ACETAMINOPHEN 5-325 MG PO TABS: 2.0000 | ORAL_TABLET | ORAL | Status: DC | PRN

## 2021-09-13 MED ORDER — IBUPROFEN 600 MG PO TABS
600.0000 mg | ORAL_TABLET | Freq: Four times a day (QID) | ORAL | Status: DC
  Administered 2021-09-13 – 2021-09-15 (×8): 600 mg via ORAL
  Filled 2021-09-13 (×8): qty 1

## 2021-09-13 MED ORDER — COCONUT OIL OIL
1.0000 "application " | TOPICAL_OIL | Status: DC | PRN
  Administered 2021-09-14: 1 via TOPICAL

## 2021-09-13 MED ORDER — ACETAMINOPHEN 325 MG PO TABS
650.0000 mg | ORAL_TABLET | ORAL | Status: DC | PRN
  Administered 2021-09-13: 650 mg via ORAL
  Filled 2021-09-13: qty 2

## 2021-09-13 MED ORDER — TETANUS-DIPHTH-ACELL PERTUSSIS 5-2.5-18.5 LF-MCG/0.5 IM SUSY: 0.5000 mL | PREFILLED_SYRINGE | Freq: Once | INTRAMUSCULAR | Status: DC

## 2021-09-13 MED ORDER — FENTANYL-BUPIVACAINE-NACL 0.5-0.125-0.9 MG/250ML-% EP SOLN
12.0000 mL/h | EPIDURAL | Status: DC | PRN
  Administered 2021-09-13: 12 mL/h via EPIDURAL
  Filled 2021-09-13: qty 250

## 2021-09-13 NOTE — Progress Notes (Signed)
Operative Delivery Note At 7:50 PM a viable female was delivered via Vaginal, Spontaneous.  Presentation: vertex; Position: Right,, Occiput,, Anterior; Station: +3  Verbal consent: obtained from patient.  Risks and benefits discussed in detail.  Risks include, but are not limited to the risks of anesthesia, bleeding, infection, damage to maternal tissues, fetal cephalhematoma.  There is also the risk of inability to effect vaginal delivery of the head, or shoulder dystocia that cannot be resolved by established maneuvers, leading to the need for emergency cesarean section. Patient making rapid progress with Hx of first two deliveries in 3 contractions. She is 10 cm with a bulging bag and feeling a lot of pressure. CBC is pending. AROM for clear fluid. Patient with very strong urge to push. Vtx if OP at this time. She pushes and then requests assistance. Vtx is +3 with a push. After D/W patient Kiwi VE is applied and used for 3-4 UCs. Persistent OP with little progress. At this point CBC has returned. Patient has her control . Anesthesiology is called and epidural is placed. FHT cat one. Patient placed on her side with peanut. After about 45 min vtx is ROT and +1. Pushing resumed and she makes good progress. SVD VMI Good cry and tone at delivery. Baby then had some dusky periods and placed on blow by oxygen. Neonatology called and is evaluating baby.  APGAR: , ; weight 6 lb 11.6 oz (3050 g).   Placenta status: intact, to path.   Cord:  with the following complications: nuchal cord x 1.  Cord pH:   Anesthesia:   Instruments: Kiwi Episiotomy: None Lacerations:  second degress ML lac repaired Suture Repair: 2.0 vicryl rapide Est. Blood Loss (mL):  100  Mom to postpartum.  Baby to Couplet care / Skin to Skin.  Sheryl Byrd 09/13/2021, 8:12 PM

## 2021-09-13 NOTE — Anesthesia Procedure Notes (Signed)
Epidural Patient location during procedure: OB Start time: 09/13/2021 5:55 PM End time: 09/13/2021 6:00 PM  Staffing Anesthesiologist: Bethena Midget, MD Performed: anesthesiologist   Preanesthetic Checklist Completed: patient identified, IV checked, site marked, risks and benefits discussed, surgical consent, monitors and equipment checked, pre-op evaluation and timeout performed  Epidural Patient position: sitting Prep: DuraPrep Patient monitoring: heart rate, continuous pulse ox and blood pressure Location: L4-L5 Injection technique: LOR saline  Needle:  Needle type: Tuohy  Needle gauge: 17 G Needle length: 9 cm and 9 Needle insertion depth: 4 cm Catheter type: closed end flexible Catheter size: 20 Guage Catheter at skin depth: 10 cm Test dose: negative  Assessment Events: blood not aspirated, injection not painful, no injection resistance, no paresthesia and negative IV test  Additional Notes Patient identified. Risks/Benefits/Options discussed with patient including but not limited to bleeding, infection, nerve damage, paralysis, failed block, incomplete pain control, headache, blood pressure changes, nausea, vomiting, reactions to medication both or allergic, itching and postpartum back pain. Confirmed with bedside nurse the patient's most recent platelet count. Confirmed with patient that they are not currently taking any anticoagulation, have any bleeding history or any family history of bleeding disorders. Patient expressed understanding and wished to proceed. All questions were answered. Sterile technique was used throughout the entire procedure. Please see nursing notes for vital signs. Test dose was given through epidural needle and negative prior to continuing to dose epidural or start infusion. Warning signs of high block given to the patient including shortness of breath, tingling/numbness in hands, complete motor block, or any concerning symptoms with instructions to  call for help. Patient was given instructions on fall risk and not to get out of bed. All questions and concerns addressed with instructions to call with any issues.

## 2021-09-13 NOTE — Lactation Note (Signed)
This note was copied from a baby's chart. Lactation Consultation Note  Patient Name: Sheryl Byrd [id number] Date: 09/13/2021 Reason for consult: L&D Initial assessment;Late-preterm 34-36.6wks Age:33 hours  L&D consult with 54 minutes old infant and P3 mother. Congratulated family on newborn.  Offered assistance with latch, laid back position. "Francesco Sor" gives a few suckles, no swallows and holds nipple in mouth. Talked briefly about LPTI behavior. Discussed STS as ideal transition for infants after birth. Talked about primal reflexes. Explained LC services availability during postpartum stay. Thanked family for their time.    Maternal Data Has patient been taught Hand Expression?: Yes Does the patient have breastfeeding experience prior to this delivery?: Yes How long did the patient breastfeed?: 22 months with 2nd child; 1st child was formula-fed  Feeding Mother's Current Feeding Choice: Breast Milk  LATCH Score Latch: Repeated attempts needed to sustain latch, nipple held in mouth throughout feeding, stimulation needed to elicit sucking reflex.  Audible Swallowing: None  Type of Nipple: Everted at rest and after stimulation  Comfort (Breast/Nipple): Soft / non-tender  Hold (Positioning): Assistance needed to correctly position infant at breast and maintain latch.  LATCH Score: 6  Interventions Interventions: Breast feeding basics reviewed;Assisted with latch;Skin to skin;Breast massage;Hand express;Adjust position;Expressed milk;Support pillows;Education  Discharge Pump: Personal WIC Program: No  Consult Status Consult Status: Follow-up from L&D    [name] [name] 09/13/2021, 8:49 PM

## 2021-09-13 NOTE — MAU Provider Note (Signed)
Chief Complaint:  Contractions and Fever   Event Date/Time   First Provider Initiated Contact with Patient 09/12/21 2343     HPI: Sheryl Byrd is a 35 y.o. G3P2002 at [redacted]w[redacted]d who presents to maternity admissions reporting sore throat, cough, headache, back pain, mild contractions, nausea and vomiting (3-4x since 1700). Her symptoms began yesterday and she has a daughter at home who recently tested positive for flu A. Denies vaginal bleeding, leaking of fluid or decreased fetal movement.  Pregnancy Course: Receives prenatal care at [facility], reports an uncomplicated pregnancy.  Past Medical History:  Diagnosis Date   Anxiety    PP anxiety   Depression    Headache    OB History  Gravida Para Term Preterm AB Living  3 2 2     2   SAB IAB Ectopic Multiple Live Births        0 2    # Outcome Date GA Lbr Len/2nd Weight Sex Delivery Anes PTL Lv  3 Current           2 Term 12/06/18 [redacted]w[redacted]d 05:27 / 00:19 6 lb 9.1 oz (2.98 kg) M Vag-Spont EPI  LIV     Birth Comments: n/a  1 Term 2018 [redacted]w[redacted]d   F Vag-Spont EPI  LIV   Past Surgical History:  Procedure Laterality Date   APPENDECTOMY     LEEP     Family History  Problem Relation Age of Onset   Heart disease Father    Hypertension Father    Stroke Maternal Grandmother    Diabetes Maternal Grandfather    Heart disease Paternal Grandfather    Social History   Tobacco Use   Smoking status: Never   Smokeless tobacco: Never  Vaping Use   Vaping Use: Never used  Substance Use Topics   Alcohol use: Not Currently   Drug use: Never   No Known Allergies Medications Prior to Admission  Medication Sig Dispense Refill Last Dose   acetaminophen (TYLENOL) 500 MG tablet Take 500 mg by mouth every 6 (six) hours as needed.   09/12/2021   Prenatal Vit-Fe Fumarate-FA (PRENATAL MULTIVITAMIN) TABS tablet Take 1 tablet by mouth daily at 12 noon.   More than a month   I have reviewed patient's Past Medical Hx, Surgical Hx,  Family Hx, Social Hx, medications and allergies.   ROS:  Review of Systems  Constitutional:  Positive for fatigue and fever.  HENT:  Positive for sore throat.   Eyes:  Negative for photophobia and visual disturbance.  Respiratory:  Positive for cough. Negative for shortness of breath.   Gastrointestinal:  Positive for nausea and vomiting.  Genitourinary:  Positive for pelvic pain. Negative for vaginal bleeding and vaginal discharge.  Musculoskeletal:  Positive for back pain.  Neurological:  Positive for headaches. Negative for dizziness and light-headedness.   Physical Exam  Patient Vitals for the past 24 hrs:  BP Temp Pulse Resp SpO2 Height Weight  09/13/21 0317 102/64 -- (!) 114 -- -- -- --  09/13/21 0141 -- 99 F (37.2 C) -- -- -- -- --  09/12/21 2222 (!) 98/54 -- -- -- -- -- --  09/12/21 2219 -- 100.2 F (37.9 C) (!) 135 18 99 % 5\' 4"  (1.626 m) 132 lb (59.9 kg)   Constitutional: Well-developed, well-nourished female in no acute distress.  Cardiovascular: normal rate & rhythm, no murmur Respiratory: normal effort, lung sounds clear throughout GI: Abd soft, non-tender, gravid appropriate for gestational age. Pos BS x  4 MS: Extremities nontender, no edema, normal ROM Neurologic: Alert and oriented x 4.  GU: no CVA tenderness Pelvic: exam deferred, checked 3x by RN. Was 1cm, then 2cm twice.  Dilation: 2 Effacement (%): 70 Station: -2 Presentation: Vertex Exam by:: Ardelle Lesches, RN  Fetal Tracing: reactive but tachycardic at the beginning of MAU visit, down once pt received LR bolus x2 and Tylenol Baseline: 180-150 Variability: moderate Accelerations: 15x15 Decelerations: none Toco: q3-52min   Labs: Results for orders placed or performed during the hospital encounter of 09/12/21 (from the past 24 hour(s))  Urinalysis, Routine w reflex microscopic Urine, Clean Catch     Status: Abnormal   Collection Time: 09/12/21 10:30 PM  Result Value Ref Range   Color, Urine YELLOW  YELLOW   APPearance CLEAR CLEAR   Specific Gravity, Urine 1.020 1.005 - 1.030   pH 5.0 5.0 - 8.0   Glucose, UA NEGATIVE NEGATIVE mg/dL   Hgb urine dipstick MODERATE (A) NEGATIVE   Bilirubin Urine NEGATIVE NEGATIVE   Ketones, ur 80 (A) NEGATIVE mg/dL   Protein, ur 100 (A) NEGATIVE mg/dL   Nitrite NEGATIVE NEGATIVE   Leukocytes,Ua TRACE (A) NEGATIVE   RBC / HPF 0-5 0 - 5 RBC/hpf   WBC, UA 0-5 0 - 5 WBC/hpf   Bacteria, UA RARE (A) NONE SEEN   Squamous Epithelial / LPF 0-5 0 - 5   Mucus PRESENT   Resp Panel by RT-PCR (Flu A&B, Covid) Nasopharyngeal Swab     Status: Abnormal   Collection Time: 09/12/21 10:59 PM   Specimen: Nasopharyngeal Swab; Nasopharyngeal(NP) swabs in vial transport medium  Result Value Ref Range   SARS Coronavirus 2 by RT PCR NEGATIVE NEGATIVE   Influenza A by PCR POSITIVE (A) NEGATIVE   Influenza B by PCR NEGATIVE NEGATIVE   Imaging:  No results found.  MAU Course: Orders Placed This Encounter  Procedures   Resp Panel by RT-PCR (Flu A&B, Covid) Nasopharyngeal Swab   Urinalysis, Routine w reflex microscopic Urine, Clean Catch   Airborne and Contact precautions   Discharge patient   Meds ordered this encounter  Medications   lactated ringers bolus 1,000 mL   ondansetron (ZOFRAN) injection 4 mg   acetaminophen (OFIRMEV) IV 1,000 mg    Order Specific Question:   Is the patient UNABLE to take oral / enteral medications?    Answer:   Yes    Order Specific Question:   Does the patient have a contraindication to NSAID use? (If YES, specify on line 3)    Answer:   Yes    Order Specific Question:   Answers to IV acetaminophen criteria above:    Answer:   "Yes" to all criteria.   lactated ringers bolus 1,000 mL   ondansetron (ZOFRAN ODT) 4 MG disintegrating tablet    Sig: Take 1 tablet (4 mg total) by mouth every 8 (eight) hours as needed for nausea or vomiting.    Dispense:  15 tablet    Refill:  0    Order Specific Question:   Supervising Provider     Answer:   Merrily Pew   MDM: LR bolus x2, IV zofran and IV Tylenol ordered with good relief of nausea/fever, some of the pain and contractions eventually eased.  Resp panel ordered, positive for flu A. Offered Tamiflu, pt declined. Discussed symptom management and included safe medication list in AVS.  Cervix changed somewhat from first to second check but then remained unchanged.  Assessment: 1. Influenza A  2. False labor   3. NST (non-stress test) reactive    Plan: Discharge home in stable condition with term labor precautions     Follow-up Information     [place]   Why: as scheduled for ongoing prenatal care Contact information: [address] [place] [address] [phone]                 Allergies as of 09/13/2021   No Known Allergies      Medication List     TAKE these medications    acetaminophen 500 MG tablet Commonly known as: TYLENOL Take 500 mg by mouth every 6 (six) hours as needed.   ondansetron 4 MG disintegrating tablet Commonly known as: Zofran ODT Take 1 tablet (4 mg total) by mouth every 8 (eight) hours as needed for nausea or vomiting.   prenatal multivitamin Tabs tablet Take 1 tablet by mouth daily at 12 noon.       Edd Arbour, CNM, MSN, IBCLC Certified Nurse Midwife, [facility]

## 2021-09-13 NOTE — Anesthesia Preprocedure Evaluation (Signed)
Anesthesia Evaluation  Patient identified by MRN, date of birth, ID band Patient awake    Reviewed: Allergy & Precautions, H&P , NPO status , Patient's Chart, lab work & pertinent test results, reviewed documented beta blocker date and time   Airway Mallampati: II  TM Distance: >3 FB Neck ROM: full    Dental no notable dental hx. (+) Teeth Intact, Dental Advisory Given   Pulmonary neg pulmonary ROS,    Pulmonary exam normal breath sounds clear to auscultation       Cardiovascular Exercise Tolerance: Good negative cardio ROS   Rhythm:regular Rate:Normal     Neuro/Psych  Headaches, PSYCHIATRIC DISORDERS Anxiety Depression    GI/Hepatic negative GI ROS, Neg liver ROS,   Endo/Other  negative endocrine ROS  Renal/GU negative Renal ROS  negative genitourinary   Musculoskeletal   Abdominal   Peds  Hematology negative hematology ROS (+)   Anesthesia Other Findings   Reproductive/Obstetrics negative OB ROS                             Anesthesia Physical Anesthesia Plan  ASA: 2  Anesthesia Plan: General   Post-op Pain Management:    Induction:   PONV Risk Score and Plan:   Airway Management Planned: Natural Airway  Additional Equipment:   Intra-op Plan:   Post-operative Plan:   Informed Consent: I have reviewed the patients History and Physical, chart, labs and discussed the procedure including the risks, benefits and alternatives for the proposed anesthesia with the patient or authorized representative who has indicated his/her understanding and acceptance.     Dental Advisory Given  Plan Discussed with: CRNA and Anesthesiologist  Anesthesia Plan Comments:         Anesthesia Quick Evaluation

## 2021-09-13 NOTE — H&P (Signed)
Sheryl Byrd is a 35 y.o. female presenting for UCs. Her children have Flu A. Yesterday she tested positive for Flu A. Today UCs getting stronger. Cx = 8 cm in MAU. OB History     Gravida  3   Para  2   Term  2   Preterm      AB      Living  2      SAB      IAB      Ectopic      Multiple  0   Live Births  2          Past Medical History:  Diagnosis Date   Anxiety    PP anxiety   Depression    Headache    Past Surgical History:  Procedure Laterality Date   APPENDECTOMY     LEEP     Family History: family history includes Diabetes in her maternal grandfather; Heart disease in her father and paternal grandfather; Hypertension in her father; Stroke in her maternal grandmother. Social History:  reports that she has never smoked. She has never used smokeless tobacco. She reports that she does not currently use alcohol. She reports that she does not use drugs.     Maternal Diabetes: No Genetic Screening: Normal Maternal Ultrasounds/Referrals: Normal Fetal Ultrasounds or other Referrals:  None Maternal Substance Abuse:  No Significant Maternal Medications:  None Significant Maternal Lab Results:  None Other Comments:  None  Review of Systems  Constitutional:  Positive for fever.  Maternal Medical History:  Reason for admission: Contractions.   Fetal activity: Perceived fetal activity is normal.    Dilation: 10 Effacement (%): 100 Station: 0 Exam by:: Dr. Henderson Cloud Blood pressure 104/63, pulse 100, temperature 99.3 F (37.4 C), temperature source Axillary, resp. rate 17, height 5\' 4"  (1.626 m), weight 60.8 kg, last menstrual period 01/04/2021, SpO2 100 %, unknown if currently breastfeeding. Maternal Exam:  Abdomen: Fetal presentation: vertex   Fetal Exam Fetal State Assessment: Category I - tracings are normal.  Physical Exam Cardiovascular:     Rate and Rhythm: Normal rate.  Pulmonary:     Effort: Pulmonary effort is normal.    Prenatal  labs: ABO, Rh: --/--/A POS (11/14 2300) Antibody: NEG (11/14 2300) Rubella: Immune (05/17 0000) RPR: Nonreactive (05/17 0000)  HBsAg: Negative (05/17 0000)  HIV: Non-reactive (09/21 0000)  GBS:   pending  Assessment/Plan: 35 yo G3P2 @ 36 4/7 wks in active labor Ampicillin for GBBS prophylaxis ordered   6/7 II 09/13/2021, 8:07 PM

## 2021-09-13 NOTE — MAU Provider Note (Signed)
History     CSN: [id number]  Arrival date and time: 09/13/21 1231   Event Date/Time   First Provider Initiated Contact with Patient 09/13/21 1351      Chief Complaint  Patient presents with   Vaginal Bleeding   Contractions   35 y.o. G3P2002 @36 .4 wks presenting for worsening ctx and VB. Was seen last night for ctx and flu. Ctx had spaced out by 10 am then around 11 am they became q4-5 min and stronger. Also reports VB about 3/4 of maxi pad. +FM.   OB History     Gravida  3   Para  2   Term  2   Preterm      AB      Living  2      SAB      IAB      Ectopic      Multiple  0   Live Births  2           Past Medical History:  Diagnosis Date   Anxiety    PP anxiety   Depression    Headache     Past Surgical History:  Procedure Laterality Date   APPENDECTOMY     LEEP      Family History  Problem Relation Age of Onset   Heart disease Father    Hypertension Father    Stroke Maternal Grandmother    Diabetes Maternal Grandfather    Heart disease Paternal Grandfather     Social History   Tobacco Use   Smoking status: Never   Smokeless tobacco: Never  Vaping Use   Vaping Use: Never used  Substance Use Topics   Alcohol use: Not Currently   Drug use: Never    Allergies: No Known Allergies  Medications Prior to Admission  Medication Sig Dispense Refill Last Dose   acetaminophen (TYLENOL) 500 MG tablet Take 500 mg by mouth every 6 (six) hours as needed.   09/13/2021   guaiFENesin (MUCINEX) 600 MG 12 hr tablet Take by mouth.   09/13/2021   ondansetron (ZOFRAN ODT) 4 MG disintegrating tablet Take 1 tablet (4 mg total) by mouth every 8 (eight) hours as needed for nausea or vomiting. 15 tablet 0 09/13/2021   Prenatal Vit-Fe Fumarate-FA (PRENATAL MULTIVITAMIN) TABS tablet Take 1 tablet by mouth daily at 12 noon.   Past Month    Review of Systems  Gastrointestinal:  Positive for abdominal pain (ctx).  Genitourinary:  Positive for vaginal  bleeding.  Physical Exam   Blood pressure 104/61, pulse (!) 115, temperature 98.1 F (36.7 C), temperature source Oral, resp. rate 20, last menstrual period 01/04/2021, SpO2 99 %, unknown if currently breastfeeding.  Physical Exam Constitutional:      General: She is not in acute distress.    Appearance: Normal appearance.  HENT:     Head: Normocephalic and atraumatic.  Pulmonary:     Effort: No respiratory distress.  Musculoskeletal:        General: Normal range of motion.     Cervical back: Normal range of motion.  Skin:    General: Skin is warm and dry.  Neurological:     General: No focal deficit present.     Mental Status: She is alert and oriented to person, place, and time.  Psychiatric:        Mood and Affect: Mood normal.        Behavior: Behavior normal.  EFM: 135 bpm, mod variability, + accels, no decels  Toco: irregular  Results for orders placed or performed during the hospital encounter of 09/13/21 (from the past 24 hour(s))  Urinalysis, Routine w reflex microscopic Urine, Clean Catch     Status: Abnormal   Collection Time: 09/13/21  2:17 PM  Result Value Ref Range   Color, Urine YELLOW YELLOW   APPearance CLEAR CLEAR   Specific Gravity, Urine 1.012 1.005 - 1.030   pH 5.0 5.0 - 8.0   Glucose, UA NEGATIVE NEGATIVE mg/dL   Hgb urine dipstick MODERATE (A) NEGATIVE   Bilirubin Urine NEGATIVE NEGATIVE   Ketones, ur 80 (A) NEGATIVE mg/dL   Protein, ur 30 (A) NEGATIVE mg/dL   Nitrite NEGATIVE NEGATIVE   Leukocytes,Ua NEGATIVE NEGATIVE   RBC / HPF 0-5 0 - 5 RBC/hpf   WBC, UA 0-5 0 - 5 WBC/hpf   Bacteria, UA RARE (A) NONE SEEN   Squamous Epithelial / LPF 0-5 0 - 5   Mucus PRESENT    MAU Course  Procedures  MDM Labs ordered and reviewed. 1520: No cervical change on recheck, pt reports worsening pain, Morphine ordered.  1650: SVE 7.5cm, Dr. Henderson Cloud notified and will place admit ordered.   Assessment and Plan  [redacted] weeks gestation NST reactive Active  labor Admit to LD Mngt per Dr. Su Hoff, CNM 09/13/2021, 5:02 PM

## 2021-09-13 NOTE — Discharge Instructions (Signed)

## 2021-09-13 NOTE — Lactation Note (Addendum)
This note was copied from a baby's chart. Lactation Consultation Note  Patient Name: Sheryl Byrd [id number] Date: 09/13/2021 Reason for consult: Initial assessment;Mother's request;Late-preterm 34-36.6wks;Breastfeeding assistance Age:33 hours  LC assisted with latching infant at breast in football. Mom denied any pain with the latch.  LC reviewed behavior of LPTI to reduce calorie loss including keeping total feeding under 30 min.  Mom usually likes to establish baby at breast before pumping. LC reviewed Mom offer EBM from hand expression or pumping via spoon 5-10 ml after latch. LC not able to access flange size at time of visit, we working on latching.   Plan 1. To feed based on cues 8-12x 24 hr period. Mom to offer breasts and look for sign of milk transfer.   2. Mom to supplement after latch EBM 5-10 ml via spoon  3 DEBP q 3hrs for 15 min  4. I and O sheet reviewed.  All questions answered at the end of the visit.   Maternal Data Has patient been taught Hand Expression?: Yes Does the patient have breastfeeding experience prior to this delivery?: Yes How long did the patient breastfeed?: 22 months  Feeding Mother's Current Feeding Choice: Breast Milk  LATCH Score Latch: Repeated attempts needed to sustain latch, nipple held in mouth throughout feeding, stimulation needed to elicit sucking reflex.  Audible Swallowing: Spontaneous and intermittent  Type of Nipple: Everted at rest and after stimulation  Comfort (Breast/Nipple): Soft / non-tender  Hold (Positioning): Assistance needed to correctly position infant at breast and maintain latch.  LATCH Score: 8   Lactation Tools Discussed/Used Tools: Pump;Flanges Flange Size: 24 Breast pump type: Double-Electric Breast Pump Pump Education: Setup, frequency, and cleaning;Milk Storage Reason for Pumping: increase stimulation Pumping frequency: every 3 hrs for  Interventions Interventions: Breast feeding basics  reviewed;Support pillows;Education;Assisted with latch;Position options;Pace feeding;Skin to skin;Expressed [employer];Infant Driven Feeding Algorithm education;Hand express;Breast compression;DEBP;Adjust position  Discharge Pump: Personal WIC Program: No  Consult Status Consult Status: Follow-up Date: 09/14/21 Follow-up type: In-patient    Donabelle Molden  Nicholson-Springer 09/13/2021, 11:07 PM

## 2021-09-13 NOTE — Plan of Care (Signed)
  Problem: Education: Goal: Knowledge of Childbirth will improve Outcome: Adequate for Discharge Goal: Ability to make informed decisions regarding treatment and plan of care will improve Outcome: Adequate for Discharge Goal: Ability to state and carry out methods to decrease the pain will improve Outcome: Adequate for Discharge Goal: Individualized Educational Video(s) Outcome: Not Applicable   Problem: Coping: Goal: Ability to verbalize concerns and feelings about labor and delivery will improve Outcome: Adequate for Discharge   Problem: Life Cycle: Goal: Ability to make normal progression through stages of labor will improve Outcome: Completed/Met Goal: Ability to effectively push during vaginal delivery will improve Outcome: Completed/Met   Problem: Role Relationship: Goal: Will demonstrate positive interactions with the child Outcome: Adequate for Discharge   Problem: Safety: Goal: Risk of complications during labor and delivery will decrease Outcome: Adequate for Discharge   Problem: Pain Management: Goal: Relief or control of pain from uterine contractions will improve Outcome: Adequate for Discharge

## 2021-09-13 NOTE — MAU Note (Signed)
Presents stating she was seen in MAU last night for ctxs and flu.  States ctxs have continued and have increased in frequency.  Reports ctxs were 10 minutes apart, now are 4-5 minutes apart.  Also reports having VB similar to a menstrual cycle, states has saturated 3/4 of a sanitary napkin in 1.5 hours and passing few nickel sized clots.  Endorses +FM.

## 2021-09-14 LAB — CBC
HCT: 30.1 % — ABNORMAL LOW (ref 36.0–46.0)
Hemoglobin: 9.7 g/dL — ABNORMAL LOW (ref 12.0–15.0)
MCH: 27.3 pg (ref 26.0–34.0)
MCHC: 32.2 g/dL (ref 30.0–36.0)
MCV: 84.8 fL (ref 80.0–100.0)
Platelets: 238 10*3/uL (ref 150–400)
RBC: 3.55 MIL/uL — ABNORMAL LOW (ref 3.87–5.11)
RDW: 13.2 % (ref 11.5–15.5)
WBC: 14.2 10*3/uL — ABNORMAL HIGH (ref 4.0–10.5)
nRBC: 0 % (ref 0.0–0.2)

## 2021-09-14 LAB — RPR: RPR Ser Ql: NONREACTIVE

## 2021-09-14 MED ORDER — DM-GUAIFENESIN ER 30-600 MG PO TB12
1.0000 | ORAL_TABLET | Freq: Two times a day (BID) | ORAL | Status: DC
  Administered 2021-09-14 – 2021-09-15 (×4): 1 via ORAL
  Filled 2021-09-14 (×7): qty 1

## 2021-09-14 MED ORDER — FLUTICASONE PROPIONATE 50 MCG/ACT NA SUSP
1.0000 | Freq: Every day | NASAL | Status: DC
  Administered 2021-09-14: 1 via NASAL
  Filled 2021-09-14: qty 16

## 2021-09-14 NOTE — Progress Notes (Signed)
RN called Dr. Henderson Cloud per pt request. Pt is feeling nasal congestion and pressure, requested if she could get Mucinex to help with Flu/Covid symptoms. RN received verbal orders to administer Mucinex DM q12. RN also verified with pharmacy that this medication is safe with breastfeeding. RN administered medication and will continue to monitor pt.   Herbert Moors, RN

## 2021-09-14 NOTE — Social Work (Signed)
MOB was referred for history of depression/anxiety/PPD.   * Referral screened out by Clinical Social Worker because none of the following criteria appear to apply: ~ History of anxiety/depression during this pregnancy, or of post-partum depression following prior delivery. ~ Diagnosis of anxiety and/or depression within last 3 years OR * MOB's symptoms currently being treated with medication and/or therapy. Per chart review, MOB takes Escitalopram 10mg  for symptoms.   Please contact the Clinical Social Worker if needs arise, by [facility] request, or if MOB scores greater than 9/yes to question 10 on Edinburgh Postpartum Depression Screen.   01-05-1976, MSW, LCSW [facility]  Clinical Social Worker  [phone] 09/14/2021  9:22 AM

## 2021-09-14 NOTE — Lactation Note (Addendum)
This note was copied from a baby's chart. Lactation Consultation Note  Patient Name: Sheryl Byrd [id number] Date: 09/14/2021 Reason for consult: Follow-up assessment;Mother's request;Nipple pain/trauma;Late-preterm 34-36.6wks;Other (Comment) (Hypoglycemia) Age:33 hours  Mom stated infant recent feeding of 18 ml  total of (EBM and DBM fortified 22 cal/oz ) prior to [facility] arrival. Mom pumping q 3hrs getting 9 ml per pumping session.  LC reviewed parents feeding volume for supplementation to increase to 10-20 ml after latching. Mom provided with slow flow nipple and pace bottle feeding reviewed for next feeding given volume of supplementation increasing.  Parents aware to keep total feeding breast and supplementation under 30 min.   Mom soreness with current use of 21 and 24 flange. With next pumping session, mom to increase to 24 and 27 flange. Mom to call out for RN or LC have any discomfort. Mom to use EBM and coconut oil for nipple care.   All questions answered at the end of the visit.   Maternal Data    Feeding Mother's Current Feeding Choice: Breast Milk and Donor Milk  LATCH Score                    Lactation Tools Discussed/Used Flange Size: 24;27 (Mom complaining of nipple pain with 21 flanges. Mom to try 24 flange with next pumping session and adjust accordingly. Mom to use EBM for nipple care.) Breast pump type: Double-Electric Breast Pump Pump Education: Setup, frequency, and cleaning;Milk Storage Reason for Pumping: increase stimulation Pumping frequency: every 3 hrs for 15 min  Interventions Interventions: Breast feeding basics reviewed;Adjust position;DEBP;Education;Pace feeding;Infant Driven Feeding Algorithm education;Hand pump;Expressed milk;Coconut oil  Discharge    Consult Status Consult Status: Follow-up Date: 09/15/21 Follow-up type: In-patient    Breindy Meadow  Nicholson-Springer 09/14/2021, 6:20 PM

## 2021-09-14 NOTE — Anesthesia Postprocedure Evaluation (Signed)
Anesthesia Post Note  Patient: Sheryl Byrd  Procedure(s) Performed: AN AD HOC LABOR EPIDURAL     Patient location during evaluation: Mother Baby Anesthesia Type: General Level of consciousness: awake and alert Pain management: pain level controlled Vital Signs Assessment: post-procedure vital signs reviewed and stable Respiratory status: spontaneous breathing, nonlabored ventilation and respiratory function stable Cardiovascular status: stable Postop Assessment: no headache, no backache and epidural receding Anesthetic complications: no   No notable events documented.  Last Vitals:  Vitals:   09/13/21 2340 09/14/21 0250  BP: 104/71 105/68  Pulse: 87 90  Resp: 17 18  Temp: 36.8 C 36.8 C  SpO2: 99% 98%    Last Pain:  Vitals:   09/14/21 0530  TempSrc:   PainSc: 3    Pain Goal: Patients Stated Pain Goal: 0 (09/13/21 1511)                 Marrion Coy

## 2021-09-14 NOTE — Progress Notes (Signed)
   09/13/21 2340 09/14/21 0030  Urine Assessment  Urine Color  --  Yellow/straw  Urine Appearance  --  Clear  Bladder Scan Volume (mL) 426 mL  --   Intermittent/Straight Cath (mL)  --  950 mL   During assessment RN noticed pt's fundus was Firm/Right/U2. RN ambulated pt to restroom and she was able to urinate but felt that she "didn't fully empty her bladder". RN bladder scanned pt which estimated . RN then straight cathed and emptied , yellow/clear. RN will continue to monitor throughout rest of shift.   Herbert Moors, RN

## 2021-09-14 NOTE — Progress Notes (Signed)
Post Partum Day 1 Subjective: no complaints, up ad lib, voiding, and tolerating PO.  Feeling mild congestion and sore throat.  No CP/SOB.  Desires circ before d/c.  Objective: Blood pressure 94/70, pulse 90, temperature 97.8 F (36.6 C), temperature source Axillary, resp. rate 18, height 5\' 4"  (1.626 m), weight 60.8 kg, last menstrual period 01/04/2021, SpO2 97 %, unknown if currently breastfeeding.  Physical Exam:  General: alert, cooperative, and appears stated age Lochia: appropriate Uterine Fundus: firm Incision: healing well DVT Evaluation: No evidence of DVT seen on physical exam. Negative Homan's sign. No cords or calf tenderness.  Recent Labs    09/13/21 1710 09/14/21 0516  HGB 11.6* 9.7*  HCT 36.3 30.1*    Assessment/Plan: Plan for discharge tomorrow, Breastfeeding, and Circumcision prior to discharge COVID and Flu A positive-mild symptoms at this time.  Continue supportive care. Circ-will defer until tomorrow considering hypoglycemia.   LOS: 1 day   09/16/21 09/14/2021, 11:19 AM

## 2021-09-15 LAB — SURGICAL PATHOLOGY

## 2021-09-15 MED ORDER — ACETAMINOPHEN 325 MG PO TABS
650.0000 mg | ORAL_TABLET | ORAL | 0 refills | Status: AC | PRN
Start: 2021-09-15 — End: ?

## 2021-09-15 MED ORDER — IBUPROFEN 600 MG PO TABS
600.0000 mg | ORAL_TABLET | Freq: Four times a day (QID) | ORAL | 0 refills | Status: AC
Start: 2021-09-15 — End: ?

## 2021-09-15 NOTE — Progress Notes (Signed)
Postpartum Progress Note  Post Partum Day 2 s/p spontaneous vaginal delivery.  Patient reports well-controlled pain, ambulating without difficulty, voiding spontaneously, tolerating PO.  Vaginal bleeding is appropriate.  She has mild congestion and sore throat.    Objective: Blood pressure 111/78, pulse 88, temperature 98.1 F (36.7 C), temperature source Oral, resp. rate 18, height 5\' 4"  (1.626 m), weight 60.8 kg, last menstrual period 01/04/2021, SpO2 100 %, unknown if currently breastfeeding.  Physical Exam:  General: alert and no distress Lochia: appropriate Uterine Fundus: firm DVT Evaluation: No evidence of DVT seen on physical exam.  Recent Labs    09/13/21 1710 09/14/21 0516  HGB 11.6* 9.7*  HCT 36.3 30.1*    Assessment/Plan: Postpartum Day 2, s/p vaginal delivery. COVID and flu positive, symptoms mild.  Baby boy - circ held yesterday for hypoglycemia, will await clearance today and also consider COVID/flu exposure as indication for deferral. Continue routine postpartum care Lactation following Anticipate discharge home later today   LOS: 2 days   09/16/21 09/15/2021, 7:45 AM

## 2021-09-15 NOTE — Discharge Summary (Signed)
Obstetric Discharge Summary  Sheryl Byrd is a 35 y.o. female that presented on 09/13/2021 for contractions at [redacted]w[redacted]d.  She was admitted to labor and delivery for labor.  Of note, she reported mild congestion and cough and was positive for both Flu A and COVID.  Her labor course was uncomplicated and she delivered a viable female infant on 09/13/21 via vacuum-assisted vaginal delivery.  Her postpartum course was uncomplicated and on PPD#2, she reported well controlled pain, spontaneous voiding, ambulating without difficulty, and tolerating PO.  She was stable for discharge home on 09/15/2021 with plans for in-office follow up.  Hemoglobin  Date Value Ref Range Status  09/14/2021 9.7 (L) 12.0 - 15.0 g/dL Final   HCT  Date Value Ref Range Status  09/14/2021 30.1 (L) 36.0 - 46.0 % Final    Physical Exam:  General: alert and no distress Lochia: appropriate Uterine Fundus: firm DVT Evaluation: No evidence of DVT seen on physical exam.  Discharge Diagnoses: Term Pregnancy-delivered  Discharge Information: Date: 09/15/2021 Activity: Pelvic rest, as tolerated Diet: routine Medications: Tylenol, motrin Condition: stable Instructions: Refer to practice specific booklet.  Discussed prior to discharge.  Discharge to: Home  Follow-up Information     [place] Follow up.   Why: Please follow up for 6 week postpartum visit. Contact information: [address] [place] [address] [phone]                 Newborn Data: Live born female  Birth Weight: 6 lb 11.6 oz (3050 g) APGAR: 8, 8  Newborn Delivery   Birth date/time: 09/13/2021 19:50:00 Delivery type: Vaginal, Spontaneous      Lyn Henri 09/15/2021, 10:33 PM

## 2021-09-16 ENCOUNTER — Ambulatory Visit: Payer: Self-pay

## 2021-09-16 NOTE — Lactation Note (Signed)
This note was copied from a baby's chart. Lactation Consultation Note  Patient Name: Boy Venesha Petraitis IEPPI'R Date: 09/16/2021   Age:33 hours Per RN Serina Cowper) Mom declined [facility] services tonight. Maternal Data    Feeding Nipple Type: Slow - flow  LATCH Score                    Lactation Tools Discussed/Used    Interventions    Discharge    Consult Status      Danelle Earthly 09/16/2021, 2:15 AM

## 2021-09-26 ENCOUNTER — Telehealth (HOSPITAL_COMMUNITY): Payer: Self-pay | Admitting: *Deleted

## 2021-09-26 NOTE — Telephone Encounter (Signed)
Mom reports feeling good. No concerns about herself at this time. EPDS=8 [facility] score=5) Discussed reaching out to OB if anxiety or sadness worsens. Mom reports she has hx of PP depression and anxiety. Hasn't filled her Rx yet, but plans to do so. Mom reports baby is doing well. Feeding, peeing, and pooping without difficulty. Safe sleep reviewed. Mom reports no concerns about baby at present.  Duffy Rhody, RN 09-26-2021 at 2:08pm

## 2022-04-01 IMAGING — CT CT ANGIO CHEST
3 of 9 series · 18 of 36 positions shown · IV contrast (Omnipaque)
Comparison: None.

CLINICAL DATA: PE suspected, high prob

Shortness of breath and chest pain. Pregnant patient at 26 weeks
gestation.
EXAM:
CT ANGIOGRAPHY CHEST WITH CONTRAST
TECHNIQUE: Multidetector CT imaging of the chest was performed using the
standard protocol during bolus administration of intravenous
contrast. Multiplanar CT image reconstructions and MIPs were
obtained to evaluate the vascular anatomy.
CONTRAST:  75mL OMNIPAQUE IOHEXOL 350 MG/ML SOLN

[Series 5: pe lung · axial · 0.91mm/px · z∈[-164,-44]mm · 3 of 81 slices shown]
[im 21/81  mediastinal]
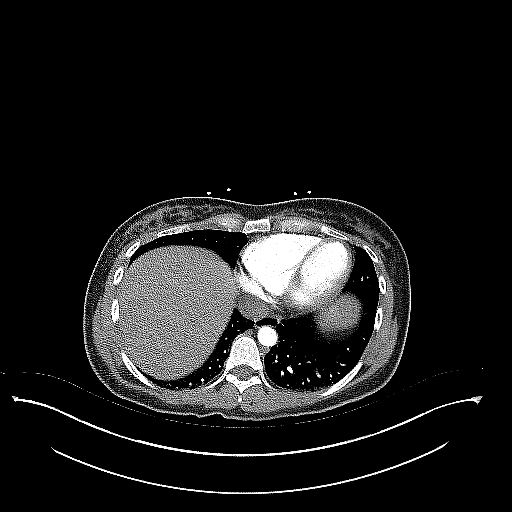
[im 41/81  mediastinal]
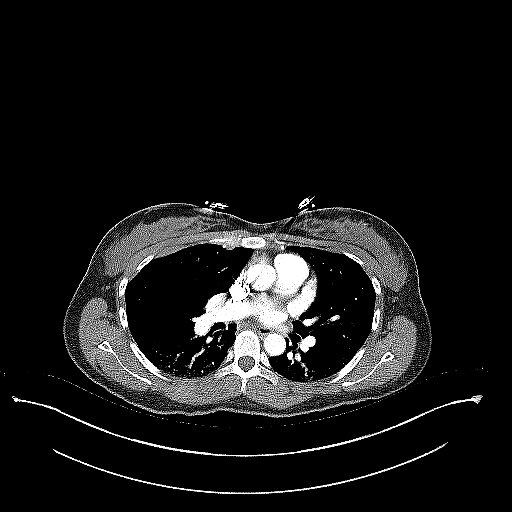
[im 61/81  mediastinal]
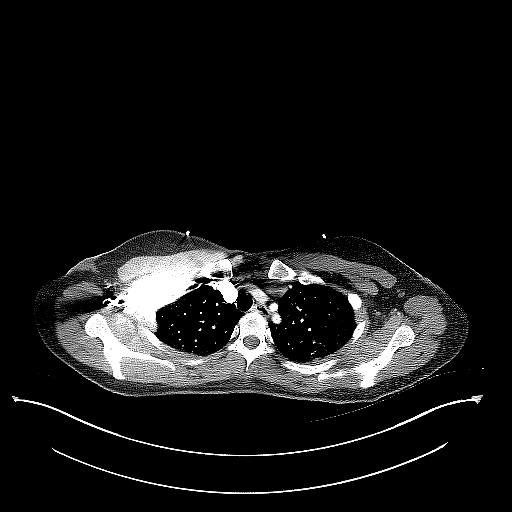

[Series 6: pe coronal mpr · coronal · 0.51mm/px · 1 of 144 slices shown]
[im 72/144  mediastinal]
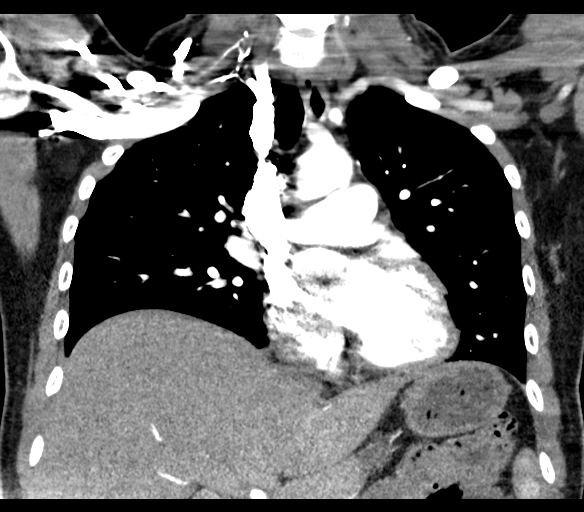

[Series 10: pe thins · axial · 0.91mm/px · z∈[-209,-1]mm · 14 of 242 slices shown]
[im 17/242  lung]
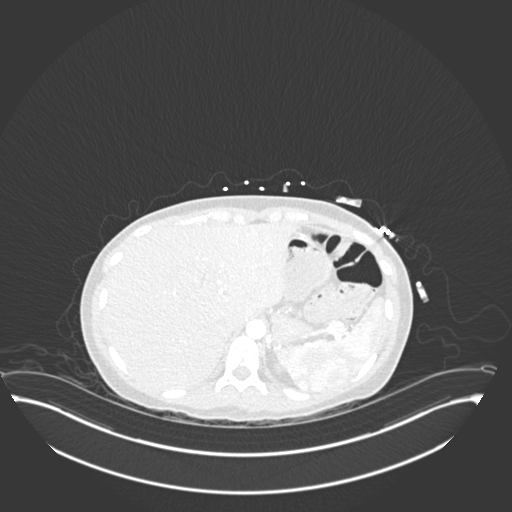
[im 33/242  mediastinal]
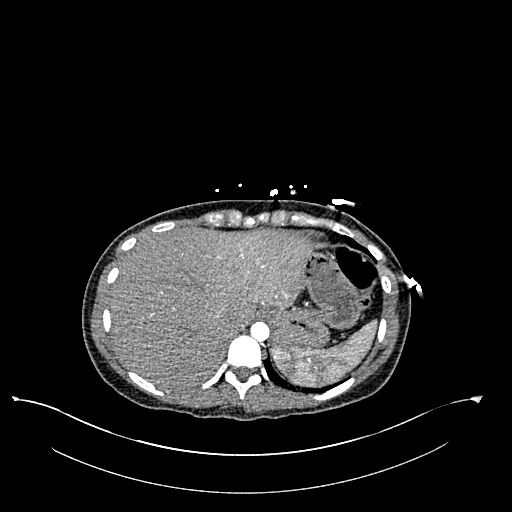
[im 49/242  lung]
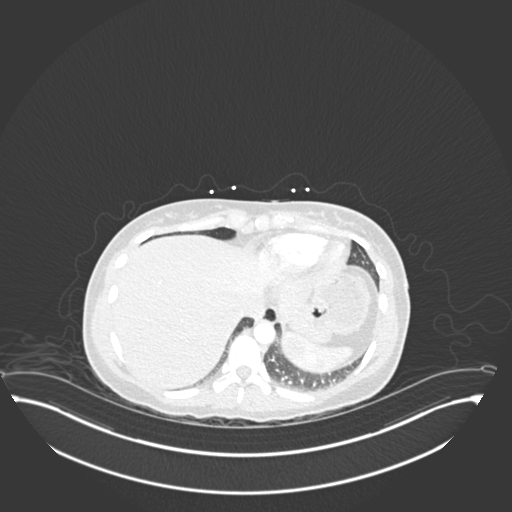
[im 65/242  mediastinal]
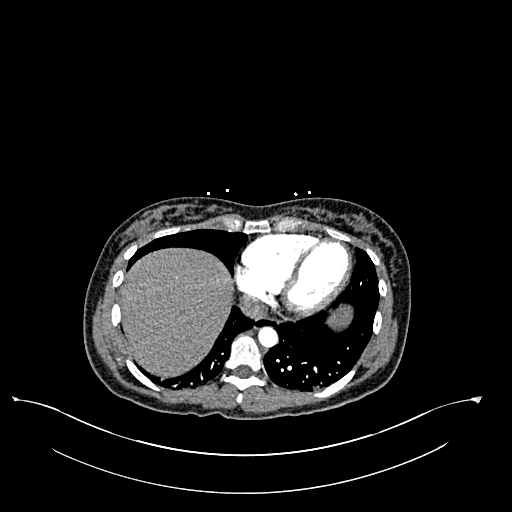
[im 81/242  lung]
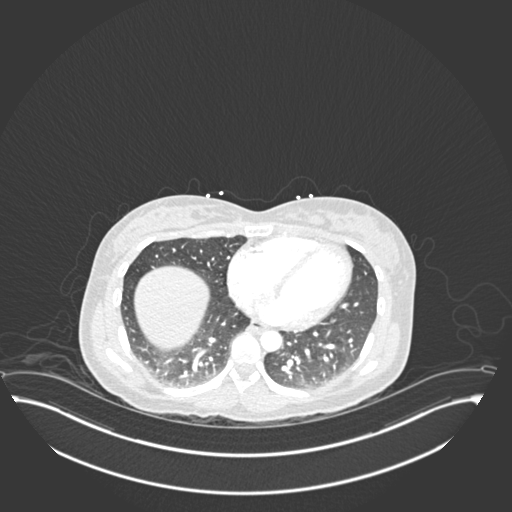
[im 97/242  mediastinal]
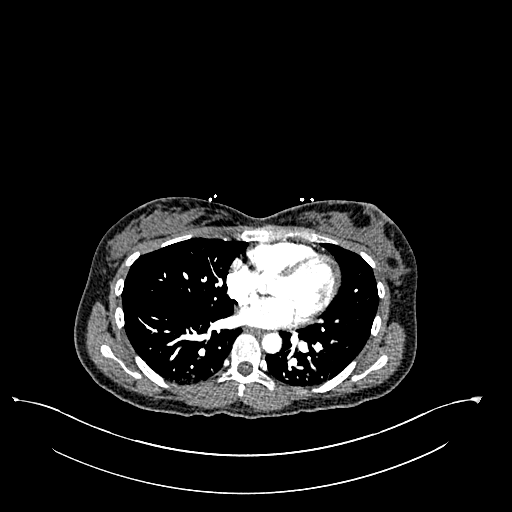
[im 113/242  lung]
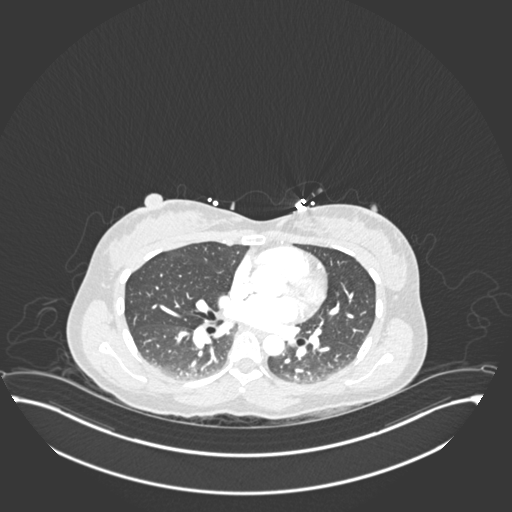
[im 129/242  mediastinal]
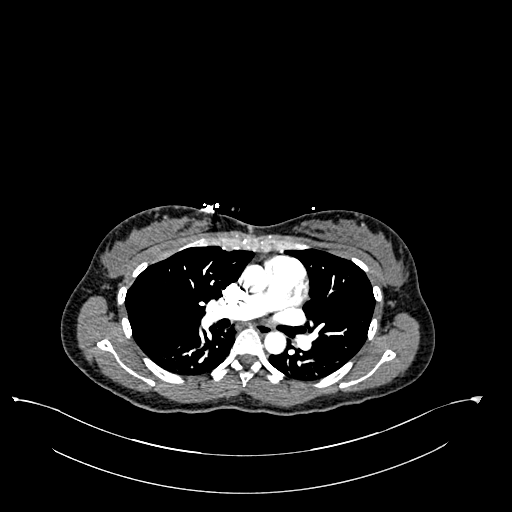
[im 145/242  lung]
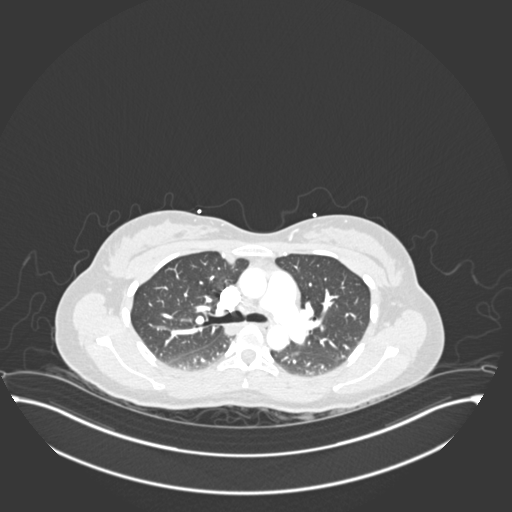
[im 161/242  mediastinal]
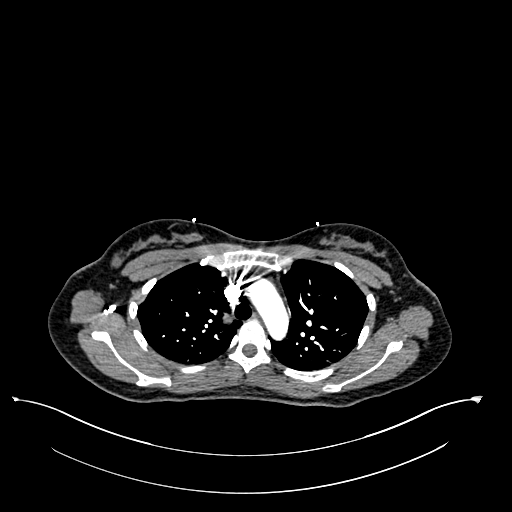
[im 177/242  lung]
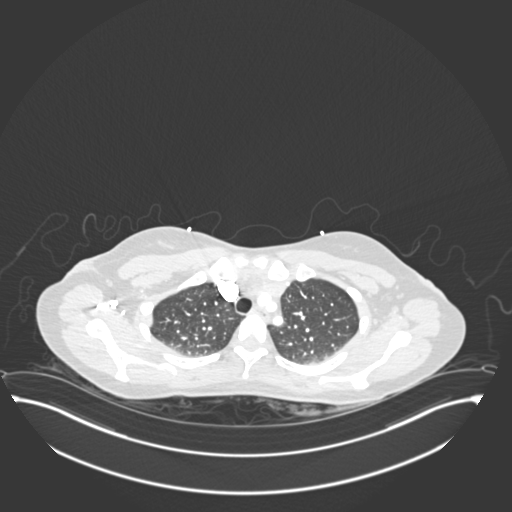
[im 193/242  mediastinal]
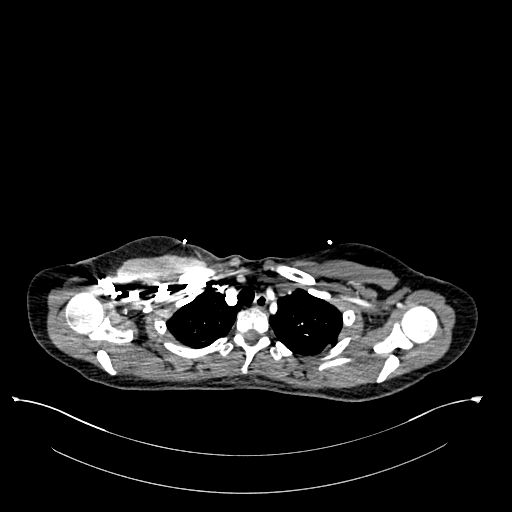
[im 209/242  lung]
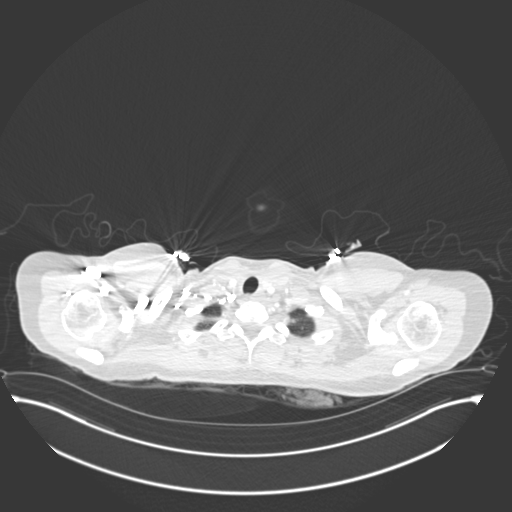
[im 225/242  mediastinal]
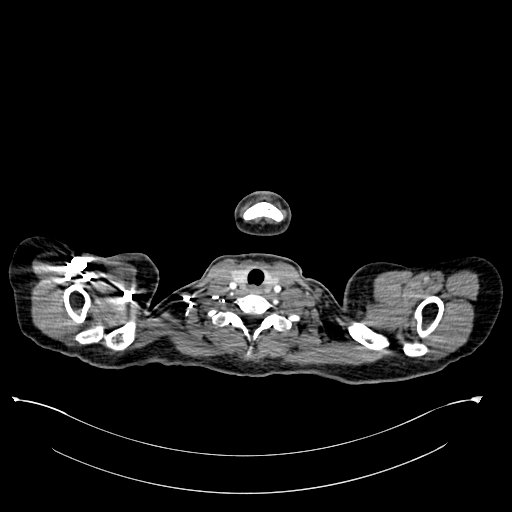

[18 of 36 positions shown; findings below may reference images not displayed]

FINDINGS: Cardiovascular: There are no filling defects within the pulmonary
arteries to suggest pulmonary embolus. Thoracic aorta is normal in
caliber. No aortic dissection or acute aortic findings. The heart is
normal in size. Trace pericardial fluid anteriorly may be trace
effusion or physiologic.

Mediastinum/Nodes: No mediastinal or hilar adenopathy. Decompressed
esophagus. No suspicious thyroid nodule.

Lungs/Pleura: Mild hypoventilatory changes in the dependent lungs.
No pneumonia or focal airspace disease. No pleural effusion. No
findings of pulmonary edema. No pneumothorax. Trachea and central
bronchi are patent.

Upper Abdomen: No acute or unexpected findings.

Musculoskeletal: There are no acute or suspicious osseous
abnormalities. No chest wall soft tissue abnormality.

Review of the MIP images confirms the above findings.
IMPRESSION: No pulmonary embolus or acute intrathoracic abnormality.
